# Patient Record
Sex: Male | Born: 1955 | Race: White | Hispanic: No | Marital: Married | Smoking: Never smoker
Health system: Southern US, Community
[De-identification: ages and names within clinical notes are randomized; demographics above are authoritative.]

## PROBLEM LIST (undated history)

## (undated) DIAGNOSIS — E785 Hyperlipidemia, unspecified: Secondary | ICD-10-CM

## (undated) HISTORY — DX: Hyperlipidemia, unspecified: E78.5

## (undated) HISTORY — PX: OTHER SURGICAL HISTORY: SHX169

## (undated) HISTORY — PX: CATARACT EXTRACTION: SUR2

## (undated) HISTORY — PX: VASECTOMY: SHX75

---

## 2015-10-08 DIAGNOSIS — K644 Residual hemorrhoidal skin tags: Secondary | ICD-10-CM | POA: Insufficient documentation

## 2015-12-24 DIAGNOSIS — E782 Mixed hyperlipidemia: Secondary | ICD-10-CM | POA: Diagnosis not present

## 2015-12-24 DIAGNOSIS — R7301 Impaired fasting glucose: Secondary | ICD-10-CM | POA: Diagnosis not present

## 2015-12-24 DIAGNOSIS — E291 Testicular hypofunction: Secondary | ICD-10-CM | POA: Diagnosis not present

## 2016-04-24 DIAGNOSIS — H25811 Combined forms of age-related cataract, right eye: Secondary | ICD-10-CM | POA: Diagnosis not present

## 2016-04-24 DIAGNOSIS — Z01818 Encounter for other preprocedural examination: Secondary | ICD-10-CM | POA: Diagnosis not present

## 2016-05-01 DIAGNOSIS — H2511 Age-related nuclear cataract, right eye: Secondary | ICD-10-CM | POA: Diagnosis not present

## 2016-05-01 DIAGNOSIS — H52221 Regular astigmatism, right eye: Secondary | ICD-10-CM | POA: Diagnosis not present

## 2016-05-01 DIAGNOSIS — H25811 Combined forms of age-related cataract, right eye: Secondary | ICD-10-CM | POA: Diagnosis not present

## 2016-05-12 DIAGNOSIS — Z1211 Encounter for screening for malignant neoplasm of colon: Secondary | ICD-10-CM | POA: Diagnosis not present

## 2016-05-12 DIAGNOSIS — Z800 Family history of malignant neoplasm of digestive organs: Secondary | ICD-10-CM | POA: Diagnosis not present

## 2016-05-12 LAB — HM COLONOSCOPY

## 2016-06-23 DIAGNOSIS — E291 Testicular hypofunction: Secondary | ICD-10-CM | POA: Diagnosis not present

## 2016-06-23 DIAGNOSIS — R7301 Impaired fasting glucose: Secondary | ICD-10-CM | POA: Diagnosis not present

## 2016-06-23 DIAGNOSIS — J018 Other acute sinusitis: Secondary | ICD-10-CM | POA: Diagnosis not present

## 2016-06-23 DIAGNOSIS — E782 Mixed hyperlipidemia: Secondary | ICD-10-CM | POA: Diagnosis not present

## 2017-05-15 DIAGNOSIS — J0100 Acute maxillary sinusitis, unspecified: Secondary | ICD-10-CM | POA: Diagnosis not present

## 2017-05-15 DIAGNOSIS — S0101XA Laceration without foreign body of scalp, initial encounter: Secondary | ICD-10-CM | POA: Diagnosis not present

## 2017-07-12 DIAGNOSIS — J018 Other acute sinusitis: Secondary | ICD-10-CM | POA: Diagnosis not present

## 2017-08-16 DIAGNOSIS — E782 Mixed hyperlipidemia: Secondary | ICD-10-CM | POA: Diagnosis not present

## 2017-08-16 DIAGNOSIS — E291 Testicular hypofunction: Secondary | ICD-10-CM | POA: Diagnosis not present

## 2017-08-16 DIAGNOSIS — R7301 Impaired fasting glucose: Secondary | ICD-10-CM | POA: Diagnosis not present

## 2017-08-16 DIAGNOSIS — J018 Other acute sinusitis: Secondary | ICD-10-CM | POA: Diagnosis not present

## 2017-08-16 DIAGNOSIS — Z23 Encounter for immunization: Secondary | ICD-10-CM | POA: Diagnosis not present

## 2018-08-14 DIAGNOSIS — L570 Actinic keratosis: Secondary | ICD-10-CM | POA: Diagnosis not present

## 2018-08-14 DIAGNOSIS — C44619 Basal cell carcinoma of skin of left upper limb, including shoulder: Secondary | ICD-10-CM | POA: Diagnosis not present

## 2018-08-14 DIAGNOSIS — D225 Melanocytic nevi of trunk: Secondary | ICD-10-CM | POA: Diagnosis not present

## 2018-08-14 DIAGNOSIS — C44212 Basal cell carcinoma of skin of right ear and external auricular canal: Secondary | ICD-10-CM | POA: Diagnosis not present

## 2018-08-14 DIAGNOSIS — D2239 Melanocytic nevi of other parts of face: Secondary | ICD-10-CM | POA: Diagnosis not present

## 2018-08-14 DIAGNOSIS — L720 Epidermal cyst: Secondary | ICD-10-CM | POA: Diagnosis not present

## 2018-08-14 DIAGNOSIS — C4491 Basal cell carcinoma of skin, unspecified: Secondary | ICD-10-CM | POA: Diagnosis not present

## 2018-08-14 DIAGNOSIS — D224 Melanocytic nevi of scalp and neck: Secondary | ICD-10-CM | POA: Diagnosis not present

## 2018-09-09 DIAGNOSIS — C44212 Basal cell carcinoma of skin of right ear and external auricular canal: Secondary | ICD-10-CM | POA: Diagnosis not present

## 2018-09-17 DIAGNOSIS — C44619 Basal cell carcinoma of skin of left upper limb, including shoulder: Secondary | ICD-10-CM | POA: Diagnosis not present

## 2019-02-14 DIAGNOSIS — Z125 Encounter for screening for malignant neoplasm of prostate: Secondary | ICD-10-CM | POA: Diagnosis not present

## 2019-02-14 DIAGNOSIS — Z0000 Encounter for general adult medical examination without abnormal findings: Secondary | ICD-10-CM | POA: Diagnosis not present

## 2019-02-21 DIAGNOSIS — E291 Testicular hypofunction: Secondary | ICD-10-CM | POA: Diagnosis not present

## 2019-04-21 DIAGNOSIS — Z23 Encounter for immunization: Secondary | ICD-10-CM | POA: Diagnosis not present

## 2019-04-21 DIAGNOSIS — H6122 Impacted cerumen, left ear: Secondary | ICD-10-CM | POA: Diagnosis not present

## 2019-05-03 DIAGNOSIS — Z20828 Contact with and (suspected) exposure to other viral communicable diseases: Secondary | ICD-10-CM | POA: Diagnosis not present

## 2019-05-03 DIAGNOSIS — J00 Acute nasopharyngitis [common cold]: Secondary | ICD-10-CM | POA: Diagnosis not present

## 2019-05-03 DIAGNOSIS — R6889 Other general symptoms and signs: Secondary | ICD-10-CM | POA: Diagnosis not present

## 2019-08-15 ENCOUNTER — Ambulatory Visit: Payer: Self-pay | Admitting: Family Medicine

## 2019-08-19 DIAGNOSIS — E291 Testicular hypofunction: Secondary | ICD-10-CM | POA: Insufficient documentation

## 2019-08-19 DIAGNOSIS — R7301 Impaired fasting glucose: Secondary | ICD-10-CM | POA: Insufficient documentation

## 2019-08-19 DIAGNOSIS — J301 Allergic rhinitis due to pollen: Secondary | ICD-10-CM | POA: Insufficient documentation

## 2019-08-19 DIAGNOSIS — E782 Mixed hyperlipidemia: Secondary | ICD-10-CM | POA: Insufficient documentation

## 2019-08-20 ENCOUNTER — Encounter: Payer: Self-pay | Admitting: Legal Medicine

## 2019-08-20 ENCOUNTER — Ambulatory Visit: Payer: Self-pay | Admitting: Legal Medicine

## 2019-08-20 ENCOUNTER — Other Ambulatory Visit: Payer: Self-pay

## 2019-08-20 VITALS — BP 106/70 | HR 72 | Temp 97.8°F | Resp 16 | Ht 74.0 in | Wt 249.8 lb

## 2019-08-20 DIAGNOSIS — Z0289 Encounter for other administrative examinations: Secondary | ICD-10-CM

## 2019-08-20 LAB — POCT URINALYSIS DIPSTICK
Bilirubin, UA: 0
Blood, UA: 0
Glucose, UA: NEGATIVE
Ketones, UA: 0
Leukocytes, UA: NEGATIVE
Nitrite, UA: 0
Protein, UA: NEGATIVE
Spec Grav, UA: 1.02 (ref 1.010–1.025)
Urobilinogen, UA: 0.2 E.U./dL
pH, UA: 6 (ref 5.0–8.0)

## 2019-08-20 LAB — POCT URINALYSIS DIP (CLINITEK)
Bilirubin, UA: NEGATIVE
Blood, UA: NEGATIVE
Glucose, UA: NEGATIVE mg/dL
Ketones, POC UA: NEGATIVE mg/dL
Leukocytes, UA: NEGATIVE
Nitrite, UA: NEGATIVE
POC PROTEIN,UA: NEGATIVE
Spec Grav, UA: 1.02 (ref 1.010–1.025)
Urobilinogen, UA: 0.2 E.U./dL
pH, UA: 6 (ref 5.0–8.0)

## 2019-08-20 NOTE — Progress Notes (Signed)
Acute Office Visit  Subjective:    Patient ID: James Craig, male    DOB: 06/17/1956, 64 y.o.   MRN: [id number]  Chief Complaint  Patient presents with  . DOT Physical    HPI Patient is in today for DOT physical.  He has no changed history from last visit  Past Medical History:  Diagnosis Date  . Hyperlipidemia     History reviewed. No pertinent surgical history.  Family History  Family history unknown: Yes    Social History   Socioeconomic History  . Marital status: Married    Spouse name: Not on file  . Number of children: Not on file  . Years of education: Not on file  . Highest education level: Not on file  Occupational History    Employer: [employer]  Tobacco Use  . Smoking status: Never Smoker  . Smokeless tobacco: Never Used  Substance and Sexual Activity  . Alcohol use: Never  . Drug use: Never  . Sexual activity: Not on file  Other Topics Concern  . Not on file  Social History Narrative  . Not on file   Social Determinants of Health   Financial Resource Strain:   . Difficulty of Paying Living Expenses: Not on file  Food Insecurity:   . Worried About Charity fundraiser in the Last Year: Not on file  . Ran Out of Food in the Last Year: Not on file  Transportation Needs:   . Lack of Transportation (Medical): Not on file  . Lack of Transportation (Non-Medical): Not on file  Physical Activity:   . Days of Exercise per Week: Not on file  . Minutes of Exercise per Session: Not on file  Stress:   . Feeling of Stress : Not on file  Social Connections:   . Frequency of Communication with Friends and Family: Not on file  . Frequency of Social Gatherings with Friends and Family: Not on file  . Attends Religious Services: Not on file  . Active Member of Clubs or Organizations: Not on file  . Attends Archivist Meetings: Not on file  . Marital Status: Not on file  Intimate Partner Violence:   . Fear of Current or Ex-Partner: Not on  file  . Emotionally Abused: Not on file  . Physically Abused: Not on file  . Sexually Abused: Not on file    Outpatient Medications Prior to Visit  Medication Sig Dispense Refill  . indomethacin (INDOCIN) 50 MG capsule Take 50 mg by mouth 3 (three) times daily after meals.    . rosuvastatin (CRESTOR) 10 MG tablet Take 10 mg by mouth daily.    . Testosterone Cypionate 200 MG/ML SOLN Inject 1 mL as directed every 14 (fourteen) days.     No facility-administered medications prior to visit.    Allergies  Allergen Reactions  . Penicillins Hives    Review of Systems  Constitutional: Negative.   HENT: Negative.   Eyes: Negative.   Respiratory: Negative.   Cardiovascular: Negative.   Gastrointestinal: Negative.   Endocrine: Negative.   Genitourinary: Negative.   Musculoskeletal: Negative.   Skin: Negative.   Neurological: Negative.        Objective:    Physical Exam Constitutional:      Appearance: Normal appearance.  HENT:     Head: Normocephalic and atraumatic.     Nose: Nose normal.  Eyes:     Extraocular Movements: Extraocular movements intact.     Conjunctiva/sclera: Conjunctivae normal.  Pupils: Pupils are equal, round, and reactive to light.  Cardiovascular:     Rate and Rhythm: Normal rate and regular rhythm.     Pulses: Normal pulses.     Heart sounds: Normal heart sounds.  Pulmonary:     Effort: Pulmonary effort is normal.     Breath sounds: Normal breath sounds.  Abdominal:     General: Bowel sounds are normal.     Palpations: Abdomen is soft.  Genitourinary:    Penis: Normal.      Testes: Normal.  Musculoskeletal:        General: Normal range of motion.     Cervical back: Normal range of motion and neck supple.  Skin:    General: Skin is warm and dry.     Capillary Refill: Capillary refill takes less than 2 seconds.  Neurological:     General: No focal deficit present.     Mental Status: He is alert and oriented to person, place, and time.  Mental status is at baseline.  Psychiatric:        Mood and Affect: Mood normal.        Behavior: Behavior normal.        Thought Content: Thought content normal.        Judgment: Judgment normal.     BP 106/70   Pulse 72   Temp 97.8 F (36.6 C)   Resp 16   Ht 6' 2"  (1.88 m)   Wt 249 lb 12.8 oz (113.3 kg)   SpO2 97%   BMI 32.07 kg/m  Wt Readings from Last 3 Encounters:  08/20/19 249 lb 12.8 oz (113.3 kg)    Health Maintenance Due  Topic Date Due  . Hepatitis C Screening  1955-11-23  . HIV Screening  06/23/1973  . TETANUS/TDAP  06/23/1977  . COLONOSCOPY  06/23/2008  . INFLUENZA VACCINE  02/08/2019    There are no preventive care reminders to display for this patient.   No results found for: TSH No results found for: WBC, HGB, HCT, MCV, PLT No results found for: NA, K, CHLORIDE, CO2, GLUCOSE, BUN, CREATININE, BILITOT, ALKPHOS, AST, ALT, PROT, ALBUMIN, CALCIUM, ANIONGAP, EGFR, GFR No results found for: CHOL No results found for: HDL No results found for: LDLCALC No results found for: TRIG No results found for: CHOLHDL No results found for: HGBA1C     Assessment & Plan:   Problem List Items Addressed This Visit    None    Visit Diagnoses    Encounter for examination required by Department of Transportation (DOT)    -  Primary   Relevant Orders   POCT urinalysis dipstick (Completed)   POCT URINALYSIS DIP (CLINITEK) (Completed)     DOT form completed for 2 year term, no restrictions  No orders of the defined types were placed in this encounter.    Reinaldo Meeker, MD

## 2019-09-19 DIAGNOSIS — D2261 Melanocytic nevi of right upper limb, including shoulder: Secondary | ICD-10-CM | POA: Diagnosis not present

## 2019-09-19 DIAGNOSIS — D225 Melanocytic nevi of trunk: Secondary | ICD-10-CM | POA: Diagnosis not present

## 2019-09-19 DIAGNOSIS — D1801 Hemangioma of skin and subcutaneous tissue: Secondary | ICD-10-CM | POA: Diagnosis not present

## 2019-09-19 DIAGNOSIS — L570 Actinic keratosis: Secondary | ICD-10-CM | POA: Diagnosis not present

## 2019-09-19 DIAGNOSIS — Z85828 Personal history of other malignant neoplasm of skin: Secondary | ICD-10-CM | POA: Diagnosis not present

## 2019-09-21 ENCOUNTER — Encounter: Payer: Self-pay | Admitting: Legal Medicine

## 2019-09-22 ENCOUNTER — Other Ambulatory Visit: Payer: Self-pay | Admitting: Family Medicine

## 2019-12-10 DIAGNOSIS — M7918 Myalgia, other site: Secondary | ICD-10-CM | POA: Diagnosis not present

## 2019-12-10 DIAGNOSIS — J069 Acute upper respiratory infection, unspecified: Secondary | ICD-10-CM | POA: Diagnosis not present

## 2020-04-15 ENCOUNTER — Ambulatory Visit (INDEPENDENT_AMBULATORY_CARE_PROVIDER_SITE_OTHER): Payer: BC Managed Care – PPO | Admitting: Family Medicine

## 2020-04-15 ENCOUNTER — Encounter: Payer: Self-pay | Admitting: Family Medicine

## 2020-04-15 ENCOUNTER — Other Ambulatory Visit: Payer: Self-pay

## 2020-04-15 VITALS — BP 120/82 | HR 60 | Temp 97.6°F | Ht 74.0 in | Wt 251.0 lb

## 2020-04-15 DIAGNOSIS — Z0000 Encounter for general adult medical examination without abnormal findings: Secondary | ICD-10-CM | POA: Diagnosis not present

## 2020-04-15 DIAGNOSIS — Z23 Encounter for immunization: Secondary | ICD-10-CM | POA: Diagnosis not present

## 2020-04-15 DIAGNOSIS — H833X3 Noise effects on inner ear, bilateral: Secondary | ICD-10-CM | POA: Diagnosis not present

## 2020-04-15 NOTE — Progress Notes (Signed)
Subjective:  Patient ID: James Craig, male    DOB: 27-Jun-1956  Age: 64 y.o. MRN: [id number]  Chief Complaint  Patient presents with  . Annual Exam    HPI  Well Adult Physical: Patient here for a comprehensive physical exam.The patient reports  Do you take any herbs or supplements that were not prescribed by a doctor? No  Are you taking calcium supplements? No  Are you taking aspirin daily? No  Encounter for general adult medical examination without abnormal findings  Physical: Patient's last physical exam was 1 year ago .  Weight: BMI 32-obese Blood Pressure: Normal (120/82)   Medical History: Patient history reviewed ; Family history reviewed ;  Allergies Reviewed: No change in current allergies ;  Medications Reviewed: Medications reviewed - no changes ;  Lipids: Labs drawn today. (Last labs 02/14/2019 TC 212, TRIG 77, LDL 148, HDL 49) Smoking: Life-long non-smoker ;  Physical Activity: Exercises at least 1 times per week (walks) is very active at work ;  Alcohol/Drug Use: Drinks 1 beer approximately once a month on social occasions ; No illicit drug use ;  Patient is not afflicted from Stress Incontinence and Urge Incontinence  Safety: reviewed ; Patient wears a seat belt, has smoke detectors, has carbon monoxide detectors, practices appropriate gun safety, and wears sunscreen with extended sun exposure. Dental Care: biannual cleanings, brushes and flosses daily. Ophthalmology/Optometry: Annual visit. Hearing loss: none Vision impairments: none.  Has implants due to previously having cataracts. Last PSA: 0.600 On 02/14/2019 (Fx Hx (Dad) of Prostate cancer.)             Social History   Socioeconomic History  . Marital status: Married    Spouse name: Not on file  . Number of children: Not on file  . Years of education: Not on file  . Highest education level: Not on file  Occupational History    Employer: [employer]  Tobacco Use  . Smoking status: Never  Smoker  . Smokeless tobacco: Never Used  Substance and Sexual Activity  . Alcohol use: Never  . Drug use: Never  . Sexual activity: Not on file  Other Topics Concern  . Not on file  Social History Narrative  . Not on file   Social Determinants of Health   Financial Resource Strain:   . Difficulty of Paying Living Expenses: Not on file  Food Insecurity:   . Worried About Programme researcher, broadcasting/film/video in the Last Year: Not on file  . Ran Out of Food in the Last Year: Not on file  Transportation Needs:   . Lack of Transportation (Medical): Not on file  . Lack of Transportation (Non-Medical): Not on file  Physical Activity:   . Days of Exercise per Week: Not on file  . Minutes of Exercise per Session: Not on file  Stress:   . Feeling of Stress : Not on file  Social Connections:   . Frequency of Communication with Friends and Family: Not on file  . Frequency of Social Gatherings with Friends and Family: Not on file  . Attends Religious Services: Not on file  . Active Member of Clubs or Organizations: Not on file  . Attends Banker Meetings: Not on file  . Marital Status: Not on file   Past Medical History:  Diagnosis Date  . Hyperlipidemia    No past surgical history on file.  Family History  Family history unknown: Yes   Social History   Socioeconomic History  .  Marital status: Married    Spouse name: Not on file  . Number of children: Not on file  . Years of education: Not on file  . Highest education level: Not on file  Occupational History    Employer: [employer]  Tobacco Use  . Smoking status: Never Smoker  . Smokeless tobacco: Never Used  Substance and Sexual Activity  . Alcohol use: Never  . Drug use: Never  . Sexual activity: Not on file  Other Topics Concern  . Not on file  Social History Narrative  . Not on file   Social Determinants of Health   Financial Resource Strain:   . Difficulty of Paying Living Expenses: Not on file  Food  Insecurity:   . Worried About Programme researcher, broadcasting/film/video in the Last Year: Not on file  . Ran Out of Food in the Last Year: Not on file  Transportation Needs:   . Lack of Transportation (Medical): Not on file  . Lack of Transportation (Non-Medical): Not on file  Physical Activity:   . Days of Exercise per Week: Not on file  . Minutes of Exercise per Session: Not on file  Stress:   . Feeling of Stress : Not on file  Social Connections:   . Frequency of Communication with Friends and Family: Not on file  . Frequency of Social Gatherings with Friends and Family: Not on file  . Attends Religious Services: Not on file  . Active Member of Clubs or Organizations: Not on file  . Attends Banker Meetings: Not on file  . Marital Status: Not on file   Review of Systems  Constitutional: Positive for diaphoresis (night). Negative for chills, fatigue and fever.  HENT: Negative for congestion (sometimes), ear pain and sore throat.   Respiratory: Negative for cough and shortness of breath.   Cardiovascular: Negative for chest pain.  Gastrointestinal: Positive for diarrhea (Recent intolerance to dairy, increased bowel movement volume ). Negative for abdominal pain, constipation, nausea and vomiting.  Genitourinary: Negative for dysuria and urgency.  Musculoskeletal: Positive for back pain (related to heavy lifting and workload). Negative for arthralgias and myalgias.  Neurological: Negative for dizziness and headaches.  Psychiatric/Behavioral: Negative for dysphoric mood. The patient is not nervous/anxious.      Objective:  BP 120/82   Pulse 60   Temp 97.6 F (36.4 C)   Ht 6\' 2"  (1.88 m)   Wt 251 lb (113.9 kg)   SpO2 99%   BMI 32.23 kg/m   BP/Weight 04/15/2020 08/20/2019  Systolic BP 120 106  Diastolic BP 82 70  Wt. (Lbs) 251 249.8  BMI 32.23 32.07    Physical Exam Constitutional:      Appearance: Normal appearance.  HENT:     Head: Normocephalic.     Right Ear: Tympanic  membrane normal.     Left Ear: Tympanic membrane normal.     Nose: Nose normal.     Mouth/Throat:     Mouth: Mucous membranes are moist.  Eyes:     Pupils: Pupils are equal, round, and reactive to light.  Cardiovascular:     Rate and Rhythm: Normal rate and regular rhythm.     Pulses: Normal pulses.     Heart sounds: Normal heart sounds.  Pulmonary:     Breath sounds: Normal breath sounds.  Abdominal:     General: Bowel sounds are normal.     Palpations: Abdomen is soft. There is no mass.     Tenderness:  There is no abdominal tenderness.     Hernia: No hernia is present.  Genitourinary:    Penis: Normal.      Testes: Normal.     Prostate: Normal.     Rectum: Normal.  Musculoskeletal:        General: Normal range of motion.     Cervical back: Neck supple.  Skin:    General: Skin is warm and dry.     Capillary Refill: Capillary refill takes less than 2 seconds.  Neurological:     General: No focal deficit present.     Mental Status: He is alert and oriented to person, place, and time.     No results found for: WBC, HGB, HCT, PLT, GLUCOSE, CHOL, TRIG, HDL, LDLDIRECT, LDLCALC, ALT, AST, NA, K, CL, CREATININE, BUN, CO2, TSH, PSA, INR, GLUF, HGBA1C, MICROALBUR    Assessment & Plan:  1. Routine medical exam - CBC with Differential/Platelet - Comprehensive metabolic panel - Lipid panel - TSH - PSA - Testosterone,Free and Total    Body mass index is 32.23 kg/m.  1. Routine medical exam - CBC with Differential/Platelet - Comprehensive metabolic panel - Lipid panel - TSH - PSA - Testosterone,Free and Total  2. Encounter for immunization - Flu Vaccine MDCK QUAD PF  3. Noise-induced hearing loss of both ears - Ambulatory referral to ENT     Monitor diet and intolerance to dairy; Increase fiber in diet   Increase weekly exercise Continue testosterone replacement Return in 2 weeks for shingles vaccine Referral to ENT for bilateral hearing loss   Health  Maintenance  Topic Date Due  .  Hepatitis C: One time screening is recommended by Center for Disease Control  (CDC) for  adults born from [birth year] through 1963.   Never done  . COVID-19 Vaccine (1) Never done  . HIV Screening  Never done  . Tetanus Vaccine  Never done  . Colon Cancer Screening  Never done  . Flu Shot  02/08/2020     AN INDIVIDUALIZED CARE PLAN: was established or reinforced today.   SELF MANAGEMENT: The patient and I together assessed ways to personally work towards obtaining the recommended goals  Support needs The patient and/or family needs were assessed and services were offered if appropriate.    Follow-up: Follow-up in 2 weeks for Shingles vaccine (nurse visit)  An After Visit Summary was printed and given to the patient.  Flonnie Hailstone, DNP [facility] [phone]

## 2020-04-15 NOTE — Patient Instructions (Addendum)
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 This includes:  A yearly physical exam. This is also called an annual well check.  Regular dental and eye exams.  Immunizations.  Screening for certain conditions.  Healthy lifestyle choices, such as eating a healthy diet, getting regular exercise, not using drugs or products that contain nicotine and tobacco, and limiting alcohol use. What can I expect for my preventive care visit? Physical exam Your health care provider will check:  Height and weight. These may be used to calculate body mass index (BMI), which is a measurement that tells if you are at a healthy weight.  Heart rate and blood pressure.  Your skin for abnormal spots. Counseling Your health care provider may ask you questions about:  Alcohol, tobacco, and drug use.  Emotional well-being.  Home and relationship well-being.  Sexual activity.  Eating habits.  Work and work Statistician. What immunizations do I need?  Influenza (flu) vaccine  This is recommended every year. Tetanus, diphtheria, and pertussis (Tdap) vaccine  You may need a Td booster every 10 years. Varicella (chickenpox) vaccine  You may need this vaccine if you have not already been vaccinated. Zoster (shingles) vaccine  You may need this after age 30. Measles, mumps, and rubella (MMR) vaccine  You may need at least one dose of MMR if you were born in 1957 or later. You may also need a second dose. Pneumococcal conjugate (PCV13) vaccine  You may need this if you have certain conditions and were not previously vaccinated. Pneumococcal polysaccharide (PPSV23) vaccine  You may need one or two doses if you smoke cigarettes or if you have certain  conditions. Meningococcal conjugate (MenACWY) vaccine  You may need this if you have certain conditions. Hepatitis A vaccine  You may need this if you have certain conditions or if you travel or work in places where you may be exposed to hepatitis A. Hepatitis B vaccine  You may need this if you have certain conditions or if you travel or work in places where you may be exposed to hepatitis B. Haemophilus influenzae type b (Hib) vaccine  You may need this if you have certain risk factors. Human papillomavirus (HPV) vaccine  If recommended by your health care provider, you may need three doses over 6 months. You may receive vaccines as individual doses or as more than one vaccine together in one shot (combination vaccines). Talk with your health care provider about the risks and benefits of combination vaccines. What tests do I need? Blood tests  Lipid and cholesterol levels. These may be checked every 5 years, or more frequently if you are over 24 years old.  Hepatitis C test.  Hepatitis B test. Screening  Lung cancer screening. You may have this screening every year starting at age 20 if you have a 30-pack-year history of smoking and currently smoke or have quit within the past 15 years.  Prostate cancer screening. Recommendations will vary depending on your family history and other risks.  Colorectal cancer screening. All adults should have this screening starting at age 3 and continuing until age 51. Your health care provider may recommend screening at age 64 if you are at increased risk. You will have tests every 1-10 years, depending on your results and the type of screening  test.  Diabetes screening. This is done by checking your blood sugar (glucose) after you have not eaten for a while (fasting). You may have this done every 1-3 years.  Sexually transmitted disease (STD) testing. Follow these instructions at home: Eating and drinking  Eat a diet that includes fresh  fruits and vegetables, whole grains, lean protein, and low-fat dairy products.  Take vitamin and mineral supplements as recommended by your health care provider.  Do not drink alcohol if your health care provider tells you not to drink.  If you drink alcohol: ? Limit how much you have to 0-2 drinks a day. ? Be aware of how much alcohol is in your drink. In the [place] one drink equals one 12 oz bottle of beer (355 mL), one 5 oz glass of wine (148 mL), or one 1 oz glass of hard liquor (44 mL). Lifestyle  Take daily care of your teeth and gums.  Stay active. Exercise for at least 30 minutes on 5 or more days each week.  Do not use any products that contain nicotine or tobacco, such as cigarettes, e-cigarettes, and chewing tobacco. If you need help quitting, ask your health care provider.  If you are sexually active, practice safe sex. Use a condom or other form of protection to prevent STIs (sexually transmitted infections).  Talk with your health care provider about taking a low-dose aspirin every day starting at age 47. What's next?  Go to your health care provider once a year for a well check visit.  Ask your health care provider how often you should have your eyes and teeth checked.  Stay up to date on all vaccines. This information is not intended to replace advice given to you by your health care provider. Make sure you discuss any questions you have with your health care provider. Document Revised: 06/20/2018 Document Reviewed: 06/20/2018 Elsevier Patient Education  [place], Adult Lactose intolerance is when the body is not able to digest lactose, a natural sugar that is found in milk and milk products. If you are lactose intolerant, avoiding foods and drinks that contain lactose may help ease digestive problems such as diarrhea or stomach pain. What are tips for following this plan? Reading food labels  Check food ingredient lists  carefully. Avoid foods made with butter, cream, milk, milk solids, milk powder, curd, caseinate, or whey.  Avoid products with the note "may contain milk."  Look for the words "lactose-free" or "lactose-reduced" on food labels. You can eat lactose-free foods, and you may be able to eat small amounts of lactose-reduced foods. Shopping  Look for nondairy substitutes, such as: ? Nondairy creamer. ? Almond or soy milk. ? Soy or coconut yogurt. ? Dairy-free cheese.  Buy lactose-free cow milk. Cooking  Avoid cooking with butter. Use vegetable, nut, and seed oils instead.  Prepare soups without cream. Use other products to thicken soups, such as corn starch or tomato paste. Meal planning  Avoid eating foods that contain lactose.  Some people with lactose intolerance can eat foods that contain small amounts of lactose. Foods that contain less than 1 gram of lactose per serving include: ? 1-2 oz of aged cheese, such as Parmesan, Swiss, or cheddar. ? 2 Tbsp of cream cheese. ? ? cup of cottage cheese. ?  cup of ricotta cheese.  Some people are able to tolerate cultured dairy products, such as yogurt, buttermilk, and kefir. The healthy bacteria in these products helps you digest lactose.  If you decide to try a food that contains lactose: ? Eat only one food with lactose in it at a time. ? Eat only a small amount of the food. ? Stop eating the food if your symptoms return. Getting enough calcium  Milk and milk products contain a lot of calcium, which is an important nutrient for your health. When you avoid milk and milk products, make sure to get calcium from other foods.  Talk with your health care provider about how much calcium you need each day. The amount of calcium you need each day depends on your age and overall health.  Nondairy foods that are high in calcium include: ? Sardines and canned salmon. ? Dried beans. ? Almonds. ? Turnip greens, collards, kale, and  broccoli. ? Calcium-fortified soy milk and tofu. ? Calcium-fortified orange juice.  Talk with your health care provider about vitamin and mineral supplements. Take supplements only as directed. Summary  Avoiding foods and drinks that contain lactose may help ease digestive problems such as diarrhea or stomach pain.  When you avoid milk and milk products, make sure to get calcium from other foods.  Take vitamin and mineral supplements only as directed by your health care provider. This information is not intended to replace advice given to you by your health care provider. Make sure you discuss any questions you have with your health care provider. Document Revised: 06/08/2017 Document Reviewed: 10/06/2016 Elsevier Patient Education  [place].  Testosterone Replacement Therapy  Testosterone replacement therapy (TRT) is used to treat men who have a low testosterone level (hypogonadism). Testosterone is a male hormone that is produced in the testicles. It is responsible for typically male characteristics and for maintaining a man's sex drive and the ability to get an erection. Testosterone also supports bone and muscle health. TRT can be a gel, liquid, or patch that you put on your skin. It can also be in the form of a tablet or an injection. In some cases, your health care provider may insert long-acting pellets under your skin. In most men, the level of testosterone starts to decline gradually after age 21. Low testosterone can also be caused by certain medical conditions, medicines, and obesity. Your health care provider can diagnose hypogonadism with at least two blood tests that are done early in the morning. Low testosterone may not need to be treated. TRT is usually a choice that you make with your health care provider. Your health care provider may recommend TRT if you have low testosterone that is causing symptoms, such as:  Low sex drive.  Erection problems.  Breast  enlargement.  Loss of body hair.  Weak muscles or bones.  Shrinking testicles.  Increased body fat.  Low energy.  Hot flashes.  Depression.  Decreased work Systems analyst. TRT is a lifetime treatment. If you stop treatment, your testosterone will drop, and your symptoms may return. What are the risks? Testosterone replacement therapy may have side effects, including:  Lower sperm count.  Skin irritation at the application or injection site.  Mouth irritation if you take an oral tablet.  Acne.  Swelling of your legs or feet.  Tender breasts.  Dizziness.  Sleep disturbance.  Mood swings.  Possible increased risk of stroke or heart attack. Testosterone replacement therapy may also increase your risk for prostate cancer or male breast cancer. You should not use TRT if you have either of those conditions. Your health care provider also may not recommend TRT if:  You are  suspected of having prostate cancer.  You want to father a child.  You have a high number of red blood cells.  You have untreated sleep apnea.  You have a very large prostate. Supplies needed:  Your health care provider will prescribe the testosterone gel, solution, or medicine that you need. If your health care provider teaches you to do self-injections at home, you will also need: ? Your medicine vial. ? Disposable needles and syringes. ? Alcohol swabs. ? A needle disposal container. ? Adhesive bandages. How to use testosterone replacement therapy Your health care provider will help you find the TRT option that will work best for you based on your preference, the side effects, and the cost. You may:  Rub testosterone gel on your upper arm or shoulder every day after a shower. This is the most common type of TRT. Do not let women or children come in contact with the gel.  Apply a testosterone solution under your arms once each day.  Place a testosterone patch on your skin once each  day.  Dissolve a testosterone tablet in your mouth twice each day.  Have a testosterone pellet inserted under your skin by your health care provider. This will be replaced every 3-6 months.  Use testosterone nasal spray three times each day.  Get testosterone injections. For some types of testosterone, your health care provider will give you this injection. With other types of testosterone, you may be taught to give injections to yourself. The frequency of injections may vary based on the type of testosterone that you receive. Follow these instructions at home:  Take over-the-counter and prescription medicines only as told by your health care provider.  Lose weight if you are overweight. Ask your health care provider to help you start a healthy diet and exercise program to reach and maintain a healthy weight.  Work with your health care provider to treat other medical conditions that may lower your testosterone. These include obesity, high blood pressure, high cholesterol, diabetes, liver disease, kidney disease, and sleep apnea.  Keep all follow-up visits as told by your health care provider. This is important. General recommendations  Discuss all risks and benefits with your health care provider before starting therapy.  Work with your health care provider to check your prostate health and do blood testing before you start therapy.  Do not use any testosterone replacement therapies that are not prescribed by your health care provider or not approved for use in the [place]  Do not use TRT for bodybuilding or to improve sexual performance. TRT should be used only to treat symptoms of low testosterone.  Return for all repeat prostate checks and blood tests during therapy, as told by your health care provider. Where to find more information Learn more about testosterone replacement therapy from:  [place]:  www.urologyhealth.org/urologic-conditions/low-testosterone-(hypogonadism)  Endocrine Society: www.hormone.org/diseases-and-conditions/mens-health/hypogonadism Contact a health care provider if:  You have side effects from your testosterone replacement therapy.  You continue to have symptoms of low testosterone during treatment.  You develop new symptoms during treatment. Summary  Testosterone replacement therapy is only for men who have low testosterone as determined by blood testing and who have symptoms of low testosterone.  Testosterone replacement therapy should be prescribed only by a health care provider and should be used under the supervision of a health care provider.  You may not be able to take testosterone if you have certain medical conditions, including prostate cancer, male breast cancer, or heart disease.  Testosterone replacement therapy may have side effects and may make some medical conditions worse.  Talk with your health care provider about all the risks and benefits before you start therapy. This information is not intended to replace advice given to you by your health care provider. Make sure you discuss any questions you have with your health care provider. Document Revised: 07/09/2016 Document Reviewed: 03/16/2016 Elsevier Patient Education  2020 [place] Maintenance, Male Adopting a healthy lifestyle and getting preventive care are important in promoting health and wellness. Ask your health care provider about:  The right schedule for you to have regular tests and exams.  Things you can do on your own to prevent diseases and keep yourself healthy. What should I know about diet, weight, and exercise? Eat a healthy diet   Eat a diet that includes plenty of vegetables, fruits, low-fat dairy products, and lean protein.  Do not eat a lot of foods that are high in solid fats, added sugars, or sodium. Maintain a healthy weight Body mass index  (BMI) is a measurement that can be used to identify possible weight problems. It estimates body fat based on height and weight. Your health care provider can help determine your BMI and help you achieve or maintain a healthy weight. Get regular exercise Get regular exercise. This is one of the most important things you can do for your health. Most adults should:  Exercise for at least 150 minutes each week. The exercise should increase your heart rate and make you sweat (moderate-intensity exercise).  Do strengthening exercises at least twice a week. This is in addition to the moderate-intensity exercise.  Spend less time sitting. Even light physical activity can be beneficial. Watch cholesterol and blood lipids Have your blood tested for lipids and cholesterol at 62 years of age, then have this test every 5 years. You may need to have your cholesterol levels checked more often if:  Your lipid or cholesterol levels are high.  You are older than 62 years of age.  You are at high risk for heart disease. What should I know about cancer screening? Many types of cancers can be detected early and may often be prevented. Depending on your health history and family history, you may need to have cancer screening at various ages. This may include screening for:  Colorectal cancer.  Prostate cancer.  Skin cancer.  Lung cancer. What should I know about heart disease, diabetes, and high blood pressure? Blood pressure and heart disease  High blood pressure causes heart disease and increases the risk of stroke. This is more likely to develop in people who have high blood pressure readings, are of African descent, or are overweight.  Talk with your health care provider about your target blood pressure readings.  Have your blood pressure checked: ? Every 3-5 years if you are 105-46 years of age. ? Every year if you are 15 years old or older.  If you are between the ages of 42 and 75 and are a  current or former smoker, ask your health care provider if you should have a one-time screening for abdominal aortic aneurysm (AAA). Diabetes Have regular diabetes screenings. This checks your fasting blood sugar level. Have the screening done:  Once every three years after age 70 if you are at a normal weight and have a low risk for diabetes.  More often and at a younger age if you are overweight or have a high risk for diabetes. What should  I know about preventing infection? Hepatitis B If you have a higher risk for hepatitis B, you should be screened for this virus. Talk with your health care provider to find out if you are at risk for hepatitis B infection. Hepatitis C Blood testing is recommended for:  Everyone born from [birth year] through 1963.  Anyone with known risk factors for hepatitis C. Sexually transmitted infections (STIs)  You should be screened each year for STIs, including gonorrhea and chlamydia, if: ? You are sexually active and are younger than 62 years of age. ? You are older than 62 years of age and your health care provider tells you that you are at risk for this type of infection. ? Your sexual activity has changed since you were last screened, and you are at increased risk for chlamydia or gonorrhea. Ask your health care provider if you are at risk.  Ask your health care provider about whether you are at high risk for HIV. Your health care provider may recommend a prescription medicine to help prevent HIV infection. If you choose to take medicine to prevent HIV, you should first get tested for HIV. You should then be tested every 3 months for as long as you are taking the medicine. Follow these instructions at home: Lifestyle  Do not use any products that contain nicotine or tobacco, such as cigarettes, e-cigarettes, and chewing tobacco. If you need help quitting, ask your health care provider.  Do not use street drugs.  Do not share needles.  Ask your health care  provider for help if you need support or information about quitting drugs. Alcohol use  Do not drink alcohol if your health care provider tells you not to drink.  If you drink alcohol: ? Limit how much you have to 0-2 drinks a day. ? Be aware of how much alcohol is in your drink. In the [place]S., one drink equals one 12 oz bottle of beer (355 mL), one 5 oz glass of wine (148 mL), or one 1 oz glass of hard liquor (44 mL). General instructions  Schedule regular health, dental, and eye exams.  Stay current with your vaccines.  Tell your health care provider if: ? You often feel depressed. ? You have ever been abused or do not feel safe at home. Summary  Adopting a healthy lifestyle and getting preventive care are important in promoting health and wellness.  Follow your health care provider's instructions about healthy diet, exercising, and getting tested or screened for diseases.  Follow your health care provider's instructions on monitoring your cholesterol and blood pressure. This information is not intended to replace advice given to you by your health care provider. Make sure you discuss any questions you have with your health care provider. Document Revised: 06/19/2018 Document Reviewed: 06/19/2018 Elsevier Patient Education  [place]. Influenza (Flu) Vaccine (Inactivated or Recombinant): What You Need to Know 1. Why get vaccinated? Influenza vaccine can prevent influenza (flu). Flu is a contagious disease that spreads around the [place] every year, usually between October and May. Anyone can get the flu, but it is more dangerous for some people. Infants and young children, people 33 years of age and older, pregnant women, and people with certain health conditions or a weakened immune system are at greatest risk of flu complications. Pneumonia, bronchitis, sinus infections and ear infections are examples of flu-related complications. If you have a medical condition, such  as heart disease, cancer or diabetes, flu can make it worse. Flu  can cause fever and chills, sore throat, muscle aches, fatigue, cough, headache, and runny or stuffy nose. Some people may have vomiting and diarrhea, though this is more common in children than adults. Each year thousands of people in the [place] die from flu, and many more are hospitalized. Flu vaccine prevents millions of illnesses and flu-related visits to the doctor each year. 2. Influenza vaccine CDC recommends everyone 53 months of age and older get vaccinated every flu season. Children 6 months through 6 years of age may need 2 doses during a single flu season. Everyone else needs only 1 dose each flu season. It takes about 2 weeks for protection to develop after vaccination. There are many flu viruses, and they are always changing. Each year a new flu vaccine is made to protect against three or four viruses that are likely to cause disease in the upcoming flu season. Even when the vaccine doesn't exactly match these viruses, it may still provide some protection. Influenza vaccine does not cause flu. Influenza vaccine may be given at the same time as other vaccines. 3. Talk with your health care provider Tell your vaccine provider if the person getting the vaccine:  Has had an allergic reaction after a previous dose of influenza vaccine, or has any severe, life-threatening allergies.  Has ever had Guillain-Barr Syndrome (also called GBS). In some cases, your health care provider may decide to postpone influenza vaccination to a future visit. People with minor illnesses, such as a cold, may be vaccinated. People who are moderately or severely ill should usually wait until they recover before getting influenza vaccine. Your health care provider can give you more information. 4. Risks of a vaccine reaction  Soreness, redness, and swelling where shot is given, fever, muscle aches, and headache can happen after influenza  vaccine.  There may be a very small increased risk of Guillain-Barr Syndrome (GBS) after inactivated influenza vaccine (the flu shot). Young children who get the flu shot along with pneumococcal vaccine (PCV13), and/or DTaP vaccine at the same time might be slightly more likely to have a seizure caused by fever. Tell your health care provider if a child who is getting flu vaccine has ever had a seizure. People sometimes faint after medical procedures, including vaccination. Tell your provider if you feel dizzy or have vision changes or ringing in the ears. As with any medicine, there is a very remote chance of a vaccine causing a severe allergic reaction, other serious injury, or death. 5. What if there is a serious problem? An allergic reaction could occur after the vaccinated person leaves the clinic. If you see signs of a severe allergic reaction (hives, swelling of the face and throat, difficulty breathing, a fast heartbeat, dizziness, or weakness), call [id number] and get the person to the nearest hospital. For other signs that concern you, call your health care provider. Adverse reactions should be reported to the Vaccine Adverse Event Reporting System (VAERS). Your health care provider will usually file this report, or you can do it yourself. Visit the VAERS website at www.vaers.SamedayNews.es or call (430)660-1938.VAERS is only for reporting reactions, and VAERS staff do not give medical advice. 6. The National Vaccine Injury Compensation Program The [employer] Vaccine Injury Compensation Program (VICP) is a federal program that was created to compensate people who may have been injured by certain vaccines. Visit the VICP website at GoldCloset.com.ee or call (713)718-3897 to learn about the program and about filing a claim. There is a time limit to  file a claim for compensation. 7. How can I learn more?  Ask your healthcare provider.  Call your local or state health department.  Contact  the Centers for Disease Control and Prevention (CDC): ? Call (302) 632-6929 (1-800-CDC-INFO) or ? Visit CDC's https://gibson.com/ Vaccine Information Statement (Interim) Inactivated Influenza Vaccine (02/21/2018) This information is not intended to replace advice given to you by your health care provider. Make sure you discuss any questions you have with your health care provider. Document Revised: 10/15/2018 Document Reviewed: 02/25/2018 Elsevier Patient Education  [place]. https://www.cdc.gov/vaccines/hcp/vis/vis-statements/tdap.pdf">  Tdap (Tetanus, Diphtheria, Pertussis) Vaccine: What You Need to Know 1. Why get vaccinated? Tdap vaccine can prevent tetanus, diphtheria, and pertussis. Diphtheria and pertussis spread from person to person. Tetanus enters the body through cuts or wounds.  TETANUS (T) causes painful stiffening of the muscles. Tetanus can lead to serious health problems, including being unable to open the mouth, having trouble swallowing and breathing, or death.  DIPHTHERIA (D) can lead to difficulty breathing, heart failure, paralysis, or death.  PERTUSSIS (aP), also known as "whooping cough," can cause uncontrollable, violent coughing which makes it hard to breathe, eat, or drink. Pertussis can be extremely serious in babies and young children, causing pneumonia, convulsions, brain damage, or death. In teens and adults, it can cause weight loss, loss of bladder control, passing out, and rib fractures from severe coughing. 2. Tdap vaccine Tdap is only for children 7 years and older, adolescents, and adults.  Adolescents should receive a single dose of Tdap, preferably at age 41 or 27 years. Pregnant women should get a dose of Tdap during every pregnancy, to protect the newborn from pertussis. Infants are most at risk for severe, life-threatening complications from pertussis. Adults who have never received Tdap should get a dose of Tdap. Also, adults should receive a  booster dose every 10 years, or earlier in the case of a severe and dirty wound or burn. Booster doses can be either Tdap or Td (a different vaccine that protects against tetanus and diphtheria but not pertussis). Tdap may be given at the same time as other vaccines. 3. Talk with your health care provider Tell your vaccine provider if the person getting the vaccine:  Has had an allergic reaction after a previous dose of any vaccine that protects against tetanus, diphtheria, or pertussis, or has any severe, life-threatening allergies.  Has had a coma, decreased level of consciousness, or prolonged seizures within 7 days after a previous dose of any pertussis vaccine (DTP, DTaP, or Tdap).  Has seizures or another nervous system problem.  Has ever had Guillain-Barr Syndrome (also called GBS).  Has had severe pain or swelling after a previous dose of any vaccine that protects against tetanus or diphtheria. In some cases, your health care provider may decide to postpone Tdap vaccination to a future visit.  People with minor illnesses, such as a cold, may be vaccinated. People who are moderately or severely ill should usually wait until they recover before getting Tdap vaccine.  Your health care provider can give you more information. 4. Risks of a vaccine reaction  Pain, redness, or swelling where the shot was given, mild fever, headache, feeling tired, and nausea, vomiting, diarrhea, or stomachache sometimes happen after Tdap vaccine. People sometimes faint after medical procedures, including vaccination. Tell your provider if you feel dizzy or have vision changes or ringing in the ears.  As with any medicine, there is a very remote chance of a vaccine causing a severe allergic  reaction, other serious injury, or death. 5. What if there is a serious problem? An allergic reaction could occur after the vaccinated person leaves the clinic. If you see signs of a severe allergic reaction (hives,  swelling of the face and throat, difficulty breathing, a fast heartbeat, dizziness, or weakness), call 9-1-1 and get the person to the nearest hospital. For other signs that concern you, call your health care provider.  Adverse reactions should be reported to the Vaccine Adverse Event Reporting System (VAERS). Your health care provider will usually file this report, or you can do it yourself. Visit the VAERS website at www.vaers.SamedayNews.es or call (562)791-6571. VAERS is only for reporting reactions, and VAERS staff do not give medical advice. 6. The National Vaccine Injury Compensation Program The [employer] Vaccine Injury Compensation Program (VICP) is a federal program that was created to compensate people who may have been injured by certain vaccines. Visit the VICP website at GoldCloset.com.ee or call (504) 267-6702 to learn about the program and about filing a claim. There is a time limit to file a claim for compensation. 7. How can I learn more?  Ask your health care provider.  Call your local or state health department.  Contact the Centers for Disease Control and Prevention (CDC): ? Call 561-726-6269 (1-800-CDC-INFO) or ? Visit CDC's website at http://hunter.com/ Vaccine Information Statement Tdap (Tetanus, Diphtheria, Pertussis) Vaccine (10/09/2018) This information is not intended to replace advice given to you by your health care provider. Make sure you discuss any questions you have with your health care provider. Document Revised: 10/18/2018 Document Reviewed: 10/21/2018 Elsevier Patient Education  [place].

## 2020-04-20 LAB — LIPID PANEL
Chol/HDL Ratio: 4.7 ratio (ref 0.0–5.0)
Cholesterol, Total: 223 mg/dL — ABNORMAL HIGH (ref 100–199)
HDL: 47 mg/dL (ref >39)
LDL Chol Calc (NIH): 151 mg/dL — ABNORMAL HIGH (ref 0–99)
Triglycerides: 139 mg/dL (ref 0–149)
VLDL Cholesterol Cal: 25 mg/dL (ref 5–40)

## 2020-04-20 LAB — COMPREHENSIVE METABOLIC PANEL
ALT: 32 IU/L (ref 0–44)
AST: 21 IU/L (ref 0–40)
Albumin/Globulin Ratio: 2.1 (ref 1.2–2.2)
Albumin: 4.7 g/dL (ref 3.8–4.8)
Alkaline Phosphatase: 55 IU/L (ref 44–121)
BUN/Creatinine Ratio: 11 (ref 10–24)
BUN: 11 mg/dL (ref 8–27)
Bilirubin Total: 0.9 mg/dL (ref 0.0–1.2)
CO2: 24 mmol/L (ref 20–29)
Calcium: 9.5 mg/dL (ref 8.6–10.2)
Chloride: 100 mmol/L (ref 96–106)
Creatinine, Ser: 1.03 mg/dL (ref 0.76–1.27)
GFR calc Af Amer: 90 mL/min/{1.73_m2} (ref >59)
GFR calc non Af Amer: 78 mL/min/{1.73_m2} (ref >59)
Globulin, Total: 2.2 g/dL (ref 1.5–4.5)
Glucose: 107 mg/dL — ABNORMAL HIGH (ref 65–99)
Potassium: 4.7 mmol/L (ref 3.5–5.2)
Sodium: 137 mmol/L (ref 134–144)
Total Protein: 6.9 g/dL (ref 6.0–8.5)

## 2020-04-20 LAB — CARDIOVASCULAR RISK ASSESSMENT

## 2020-04-20 LAB — CBC WITH DIFFERENTIAL/PLATELET
Basophils Absolute: 0.1 10*3/uL (ref 0.0–0.2)
Basos: 1 %
EOS (ABSOLUTE): 0.1 10*3/uL (ref 0.0–0.4)
Eos: 1 %
Hematocrit: 52.7 % — ABNORMAL HIGH (ref 37.5–51.0)
Hemoglobin: 18.3 g/dL — ABNORMAL HIGH (ref 13.0–17.7)
Immature Grans (Abs): 0 10*3/uL (ref 0.0–0.1)
Immature Granulocytes: 0 %
Lymphocytes Absolute: 1.6 10*3/uL (ref 0.7–3.1)
Lymphs: 30 %
MCH: 32.8 pg (ref 26.6–33.0)
MCHC: 34.7 g/dL (ref 31.5–35.7)
MCV: 94 fL (ref 79–97)
Monocytes Absolute: 0.4 10*3/uL (ref 0.1–0.9)
Monocytes: 8 %
Neutrophils Absolute: 3.1 10*3/uL (ref 1.4–7.0)
Neutrophils: 60 %
Platelets: 228 10*3/uL (ref 150–450)
RBC: 5.58 x10E6/uL (ref 4.14–5.80)
RDW: 12.1 % (ref 11.6–15.4)
WBC: 5.2 10*3/uL (ref 3.4–10.8)

## 2020-04-20 LAB — TESTOSTERONE,FREE AND TOTAL
Testosterone, Free: 4.9 pg/mL — ABNORMAL LOW (ref 6.6–18.1)
Testosterone: 98 ng/dL — ABNORMAL LOW (ref 264–916)

## 2020-04-20 LAB — TSH: TSH: 2.83 u[IU]/mL (ref 0.450–4.500)

## 2020-04-20 LAB — PSA: Prostate Specific Ag, Serum: 0.5 ng/mL (ref 0.0–4.0)

## 2020-04-21 ENCOUNTER — Other Ambulatory Visit: Payer: Self-pay | Admitting: Nurse Practitioner

## 2020-04-21 DIAGNOSIS — E782 Mixed hyperlipidemia: Secondary | ICD-10-CM

## 2020-04-21 MED ORDER — ROSUVASTATIN CALCIUM 5 MG PO TABS: 5.0000 mg | ORAL_TABLET | Freq: Every day | ORAL | 0 refills | Status: DC

## 2020-04-21 NOTE — Progress Notes (Signed)
Testosterone level low at 98. Pt currently receiving testosterone replacement. Please assess if he is taking it as prescribed.  Blood glucose elevated at 107. Please add HgbA1C. Lipid panel elevated (TC 223, LDL 151, Trig 139, HDL 47). Please resume Crestor 5mg  QHS.

## 2020-04-21 NOTE — Progress Notes (Signed)
Sent Crestor 5mg  QHS to pharmacy for elevated cholesterol. Patient needs a 46-month follow-up appointment for fasting lipid panel please

## 2020-04-23 ENCOUNTER — Other Ambulatory Visit: Payer: Self-pay | Admitting: Nurse Practitioner

## 2020-04-23 ENCOUNTER — Other Ambulatory Visit: Payer: Self-pay | Admitting: Family Medicine

## 2020-04-23 ENCOUNTER — Encounter: Payer: Self-pay | Admitting: Nurse Practitioner

## 2020-04-23 ENCOUNTER — Other Ambulatory Visit: Payer: Self-pay

## 2020-04-23 DIAGNOSIS — E291 Testicular hypofunction: Secondary | ICD-10-CM

## 2020-04-23 MED ORDER — TESTOSTERONE CYPIONATE 200 MG/ML IM SOLN: 200.0000 mg | INTRAMUSCULAR | 0 refills | Status: DC

## 2020-04-23 NOTE — Progress Notes (Signed)
Patient to return in 3 months for fasting lipid panel and testosterone level. Resume Crestor 5mg  daily

## 2020-04-28 DIAGNOSIS — H26491 Other secondary cataract, right eye: Secondary | ICD-10-CM | POA: Diagnosis not present

## 2020-04-29 ENCOUNTER — Ambulatory Visit: Payer: BC Managed Care – PPO

## 2020-05-04 ENCOUNTER — Ambulatory Visit (INDEPENDENT_AMBULATORY_CARE_PROVIDER_SITE_OTHER): Payer: BC Managed Care – PPO

## 2020-05-04 ENCOUNTER — Other Ambulatory Visit: Payer: Self-pay

## 2020-05-04 DIAGNOSIS — Z23 Encounter for immunization: Secondary | ICD-10-CM

## 2020-05-04 NOTE — Progress Notes (Signed)
   Shingrix vaccine given today, patient tolerated well. This was his first dose.  His second dose will be due in 2-6 months.  Jacklynn Bue, LPN 0:98 AM

## 2020-06-17 ENCOUNTER — Other Ambulatory Visit: Payer: Self-pay | Admitting: Family Medicine

## 2020-06-17 DIAGNOSIS — E291 Testicular hypofunction: Secondary | ICD-10-CM

## 2020-07-09 ENCOUNTER — Encounter: Payer: Self-pay | Admitting: Family Medicine

## 2020-07-13 ENCOUNTER — Ambulatory Visit (INDEPENDENT_AMBULATORY_CARE_PROVIDER_SITE_OTHER): Payer: BC Managed Care – PPO | Admitting: Family Medicine

## 2020-07-13 ENCOUNTER — Ambulatory Visit (INDEPENDENT_AMBULATORY_CARE_PROVIDER_SITE_OTHER): Payer: BC Managed Care – PPO

## 2020-07-13 VITALS — BP 128/62 | HR 69

## 2020-07-13 DIAGNOSIS — J329 Chronic sinusitis, unspecified: Secondary | ICD-10-CM | POA: Diagnosis not present

## 2020-07-13 DIAGNOSIS — R059 Cough, unspecified: Secondary | ICD-10-CM | POA: Diagnosis not present

## 2020-07-13 DIAGNOSIS — Z23 Encounter for immunization: Secondary | ICD-10-CM

## 2020-07-13 LAB — POC COVID19 BINAXNOW: SARS Coronavirus 2 Ag: NEGATIVE

## 2020-07-13 MED ORDER — DOXYCYCLINE HYCLATE 100 MG PO TABS: 100.0000 mg | ORAL_TABLET | Freq: Two times a day (BID) | ORAL | 0 refills | Status: DC

## 2020-07-13 NOTE — Progress Notes (Signed)
Acute Office Visit  Subjective:    Patient ID: James Craig, male    DOB: 10-31-1955, 65 y.o.   MRN: [id number]  CC: cough/congestion  HPI Patient is in today for cough, congestion, facial pressure and sore throat in the morning. NO fever, nausea, vomiting. Pt with h/o of otitis in childhood. PEN allergic. Pt here for shingrix vaccine. Pt suppose to take Crestor-no longer taking daily  Past Medical History:  Diagnosis Date  . Hyperlipidemia       Family History  Family history unknown: Yes    Social History   Socioeconomic History  . Marital status: Married    Spouse name: Not on file  . Number of children: Not on file  . Years of education: Not on file  . Highest education level: Not on file  Occupational History    Employer: [employer]  Tobacco Use  . Smoking status: Never Smoker  . Smokeless tobacco: Never Used  Substance and Sexual Activity  . Alcohol use: Never  . Drug use: Never  . Sexual activity: Not on file  Other Topics Concern  . Not on file  Social History Narrative  . Not on file   Social Determinants of Health   Financial Resource Strain: Not on file  Food Insecurity: Not on file  Transportation Needs: Not on file  Physical Activity: Not on file  Stress: Not on file  Social Connections: Not on file  Intimate Partner Violence: Not on file    Outpatient Medications Prior to Visit  Medication Sig Dispense Refill  . rosuvastatin (CRESTOR) 5 MG tablet Take 1 tablet (5 mg total) by mouth daily. 90 tablet 0  . testosterone cypionate (DEPOTESTOSTERONE CYPIONATE) 200 MG/ML injection INJECT (200MG ) INTRAMUSCULARLY EVERY2 WEEKS 10 mL 1   No facility-administered medications prior to visit.    Allergies  Allergen Reactions  . Penicillins Hives    Review of Systems  Constitutional: Negative for fatigue and fever.  HENT: Positive for congestion, sinus pressure and sore throat.   Eyes: Negative.   Respiratory: Positive for cough.  Negative for shortness of breath and wheezing.   Musculoskeletal: Negative.   Skin: Negative.   Allergic/Immunologic: Negative.   Neurological: Positive for dizziness and headaches.  Psychiatric/Behavioral: Negative.        Objective:    Physical Exam Constitutional:      Appearance: Normal appearance.  HENT:     Head: Normocephalic and atraumatic.     Right Ear: Tympanic membrane normal.     Left Ear: Tympanic membrane normal.     Nose: Congestion present.     Mouth/Throat:     Pharynx: Oropharyngeal exudate present.  Eyes:     Conjunctiva/sclera: Conjunctivae normal.  Cardiovascular:     Rate and Rhythm: Normal rate and regular rhythm.     Pulses: Normal pulses.     Heart sounds: Normal heart sounds.  Pulmonary:     Effort: Pulmonary effort is normal.     Breath sounds: Normal breath sounds.  Musculoskeletal:     Cervical back: Normal range of motion and neck supple.  Neurological:     Mental Status: He is alert.      Wt Readings from Last 3 Encounters:  04/15/20 251 lb (113.9 kg)  08/20/19 249 lb 12.8 oz (113.3 kg)    Health Maintenance Due  Topic Date Due  . Hepatitis C Screening  Never done  . HIV Screening  Never done  . COVID-19 Vaccine (3 - Booster  for Pfizer series) 04/08/2020    Lab Results  Component Value Date   TSH 2.830 04/15/2020   Lab Results  Component Value Date   WBC 5.2 04/15/2020   HGB 18.3 (H) 04/15/2020   HCT 52.7 (H) 04/15/2020   MCV 94 04/15/2020   PLT 228 04/15/2020   Lab Results  Component Value Date   NA 137 04/15/2020   K 4.7 04/15/2020   CO2 24 04/15/2020   GLUCOSE 107 (H) 04/15/2020   BUN 11 04/15/2020   CREATININE 1.03 04/15/2020   BILITOT 0.9 04/15/2020   ALKPHOS 55 04/15/2020   AST 21 04/15/2020   ALT 32 04/15/2020   PROT 6.9 04/15/2020   ALBUMIN 4.7 04/15/2020   CALCIUM 9.5 04/15/2020   Lab Results  Component Value Date   CHOL 223 (H) 04/15/2020   Lab Results  Component Value Date   HDL 47  04/15/2020   Lab Results  Component Value Date   LDLCALC 151 (H) 04/15/2020   Lab Results  Component Value Date   TRIG 139 04/15/2020   Lab Results  Component Value Date   CHOLHDL 4.7 04/15/2020       Assessment & Plan:   1. Cough Likely post nasal congestion due to sinuses - POC COVID-19-NEGATIVE  2. Sinusitis, unspecified chronicity, unspecified location Doxy-rx, mucinex, flonase LISA Mat Carne, MD

## 2020-07-13 NOTE — Progress Notes (Signed)
Pt in office today for second Shingrix vaccine. Signed consent. Pt received in L deltoid. Tolerated vaccine.

## 2020-07-22 DIAGNOSIS — H61303 Acquired stenosis of external ear canal, unspecified, bilateral: Secondary | ICD-10-CM | POA: Diagnosis not present

## 2020-07-22 DIAGNOSIS — H903 Sensorineural hearing loss, bilateral: Secondary | ICD-10-CM | POA: Diagnosis not present

## 2020-07-22 DIAGNOSIS — Z77122 Contact with and (suspected) exposure to noise: Secondary | ICD-10-CM | POA: Diagnosis not present

## 2020-07-22 DIAGNOSIS — H9319 Tinnitus, unspecified ear: Secondary | ICD-10-CM | POA: Diagnosis not present

## 2020-07-22 DIAGNOSIS — H6123 Impacted cerumen, bilateral: Secondary | ICD-10-CM | POA: Diagnosis not present

## 2020-08-31 ENCOUNTER — Telehealth: Payer: Self-pay

## 2020-08-31 NOTE — Telephone Encounter (Signed)
Pt called and left message. Pt stating he believes he has gout in his elbow. Requesting medication or advice.  Attempted to call pt back to schedule an appointment. Pt did not answer but did have identifying VM box. Left message requesting pt call back and schedule appointment for elbow.   Terrill Mohr 08/31/20 9:46 AM

## 2020-08-31 NOTE — Progress Notes (Signed)
Acute Office Visit  Subjective:    Patient ID: James Craig, male    DOB: 1956-02-14, 65 y.o.   MRN: [id number]  Chief Complaint  Patient presents with  . Elbow Pain    HPI Patient is in today for gout in elbow. Warm , painful. Pt tried diclofenac, without help.  Patient is also complaining that his testesterone is wearing off in about one week. He increased his last shot to 2 ml (400 mg) which worked great. That was 2 weeks ago.   Past Medical History:  Diagnosis Date  . Hyperlipidemia     History reviewed. No pertinent surgical history.  Family History  Family history unknown: Yes    Social History   Socioeconomic History  . Marital status: Married    Spouse name: Not on file  . Number of children: Not on file  . Years of education: Not on file  . Highest education level: Not on file  Occupational History    Employer: [employer]  Tobacco Use  . Smoking status: Never Smoker  . Smokeless tobacco: Never Used  Substance and Sexual Activity  . Alcohol use: Never  . Drug use: Never  . Sexual activity: Not on file  Other Topics Concern  . Not on file  Social History Narrative  . Not on file   Social Determinants of Health   Financial Resource Strain: Not on file  Food Insecurity: Not on file  Transportation Needs: Not on file  Physical Activity: Not on file  Stress: Not on file  Social Connections: Not on file  Intimate Partner Violence: Not on file    Outpatient Medications Prior to Visit  Medication Sig Dispense Refill  . rosuvastatin (CRESTOR) 5 MG tablet Take 1 tablet (5 mg total) by mouth daily. 90 tablet 0  . testosterone cypionate (DEPOTESTOSTERONE CYPIONATE) 200 MG/ML injection INJECT (200MG ) INTRAMUSCULARLY EVERY2 WEEKS 10 mL 1  . doxycycline (VIBRA-TABS) 100 MG tablet Take 1 tablet (100 mg total) by mouth 2 (two) times daily. 20 tablet 0   No facility-administered medications prior to visit.    Allergies  Allergen Reactions  .  Penicillins Hives    Review of Systems  Constitutional: Positive for fatigue. Negative for chills, fever and unexpected weight change.  HENT: Positive for rhinorrhea. Negative for congestion, ear pain, sinus pain and sore throat.   Respiratory: Negative for cough and shortness of breath.   Cardiovascular: Negative for chest pain and palpitations.  Gastrointestinal: Negative for abdominal pain, blood in stool, constipation, diarrhea, nausea and vomiting.  Endocrine: Negative for polydipsia.  Genitourinary: Negative for dysuria.  Musculoskeletal: Positive for arthralgias (left elbow pain and swelling ). Negative for back pain.  Skin: Negative for rash.  Neurological: Negative for headaches.  Psychiatric/Behavioral: Negative for dysphoric mood. The patient is not nervous/anxious.        Objective:    Physical Exam Constitutional:      Appearance: Normal appearance.  Abdominal:     General: Bowel sounds are normal.     Palpations: Abdomen is soft. There is no mass.     Tenderness: There is no abdominal tenderness.  Musculoskeletal:        General: Tenderness (left elbow. Swelling and erythema. ) present.  Neurological:     Mental Status: He is alert.  Psychiatric:        Mood and Affect: Mood normal.        Behavior: Behavior normal.     BP 128/80  Pulse 92   Temp 97.6 F (36.4 C)   Resp 18   Ht 6\' 2"  (1.88 m)   Wt 256 lb (116.1 kg)   BMI 32.87 kg/m  Wt Readings from Last 3 Encounters:  09/01/20 256 lb (116.1 kg)  04/15/20 251 lb (113.9 kg)  08/20/19 249 lb 12.8 oz (113.3 kg)    Health Maintenance Due  Topic Date Due  . Hepatitis C Screening  Never done  . HIV Screening  Never done  . COVID-19 Vaccine (3 - Booster for Pfizer series) 04/08/2020    There are no preventive care reminders to display for this patient.   Lab Results  Component Value Date   TSH 2.830 04/15/2020   Lab Results  Component Value Date   WBC 5.2 04/15/2020   HGB 18.3 (H)  04/15/2020   HCT 52.7 (H) 04/15/2020   MCV 94 04/15/2020   PLT 228 04/15/2020   Lab Results  Component Value Date   NA 137 04/15/2020   K 4.7 04/15/2020   CO2 24 04/15/2020   GLUCOSE 107 (H) 04/15/2020   BUN 11 04/15/2020   CREATININE 1.03 04/15/2020   BILITOT 0.9 04/15/2020   ALKPHOS 55 04/15/2020   AST 21 04/15/2020   ALT 32 04/15/2020   PROT 6.9 04/15/2020   ALBUMIN 4.7 04/15/2020   CALCIUM 9.5 04/15/2020   Lab Results  Component Value Date   CHOL 223 (H) 04/15/2020   Lab Results  Component Value Date   HDL 47 04/15/2020   Lab Results  Component Value Date   LDLCALC 151 (H) 04/15/2020   Lab Results  Component Value Date   TRIG 139 04/15/2020   Lab Results  Component Value Date   CHOLHDL 4.7 04/15/2020   No results found for: HGBA1C     Assessment & Plan:  1. Testicular hypofunction - Testosterone,Free and Total  2. Acute idiopathic gout of left elbow - Colchicine (MITIGARE) 0.6 MG CAPS; Take 1 capsule by mouth in the morning and at bedtime for 7 days.  Dispense: 30 capsule; Refill: 1 - CBC with Differential/Platelet - Uric acid    Meds ordered this encounter  Medications  . Colchicine (MITIGARE) 0.6 MG CAPS    Sig: Take 1 capsule by mouth in the morning and at bedtime for 7 days.    Dispense:  30 capsule    Refill:  1    Orders Placed This Encounter  Procedures  . Testosterone,Free and Total  . CBC with Differential/Platelet  . Uric acid     Follow-up: Return if symptoms worsen or fail to improve.  An After Visit Summary was printed and given to the patient.  Blane Ohara, MD [facility] [phone]

## 2020-09-01 ENCOUNTER — Ambulatory Visit (INDEPENDENT_AMBULATORY_CARE_PROVIDER_SITE_OTHER): Payer: BC Managed Care – PPO | Admitting: Family Medicine

## 2020-09-01 ENCOUNTER — Other Ambulatory Visit: Payer: Self-pay

## 2020-09-01 ENCOUNTER — Encounter: Payer: Self-pay | Admitting: Family Medicine

## 2020-09-01 VITALS — BP 128/80 | HR 92 | Temp 97.6°F | Resp 18 | Ht 74.0 in | Wt 256.0 lb

## 2020-09-01 DIAGNOSIS — M10022 Idiopathic gout, left elbow: Secondary | ICD-10-CM

## 2020-09-01 DIAGNOSIS — E291 Testicular hypofunction: Secondary | ICD-10-CM | POA: Diagnosis not present

## 2020-09-01 MED ORDER — COLCHICINE 0.6 MG PO CAPS: 1.0000 | ORAL_CAPSULE | Freq: Two times a day (BID) | ORAL | 1 refills | Status: DC

## 2020-09-03 LAB — CBC WITH DIFFERENTIAL/PLATELET
Basophils Absolute: 0.1 10*3/uL (ref 0.0–0.2)
Basos: 1 %
EOS (ABSOLUTE): 0.1 10*3/uL (ref 0.0–0.4)
Eos: 2 %
Hematocrit: 52.7 % — ABNORMAL HIGH (ref 37.5–51.0)
Hemoglobin: 17.9 g/dL — ABNORMAL HIGH (ref 13.0–17.7)
Immature Grans (Abs): 0 10*3/uL (ref 0.0–0.1)
Immature Granulocytes: 0 %
Lymphocytes Absolute: 1.8 10*3/uL (ref 0.7–3.1)
Lymphs: 31 %
MCH: 31.4 pg (ref 26.6–33.0)
MCHC: 34 g/dL (ref 31.5–35.7)
MCV: 93 fL (ref 79–97)
Monocytes Absolute: 0.6 10*3/uL (ref 0.1–0.9)
Monocytes: 9 %
Neutrophils Absolute: 3.4 10*3/uL (ref 1.4–7.0)
Neutrophils: 57 %
Platelets: 216 10*3/uL (ref 150–450)
RBC: 5.7 x10E6/uL (ref 4.14–5.80)
RDW: 12.3 % (ref 11.6–15.4)
WBC: 5.9 10*3/uL (ref 3.4–10.8)

## 2020-09-03 LAB — TESTOSTERONE,FREE AND TOTAL
Testosterone, Free: 10.9 pg/mL (ref 6.6–18.1)
Testosterone: 247 ng/dL — ABNORMAL LOW (ref 264–916)

## 2020-09-03 LAB — URIC ACID: Uric Acid: 7.3 mg/dL (ref 3.8–8.4)

## 2020-09-07 ENCOUNTER — Other Ambulatory Visit: Payer: Self-pay

## 2020-09-07 DIAGNOSIS — E291 Testicular hypofunction: Secondary | ICD-10-CM

## 2020-09-07 MED ORDER — TESTOSTERONE CYPIONATE 200 MG/ML IM SOLN: 250.0000 mg | INTRAMUSCULAR | 1 refills | Status: DC

## 2020-09-20 ENCOUNTER — Telehealth (INDEPENDENT_AMBULATORY_CARE_PROVIDER_SITE_OTHER): Payer: BC Managed Care – PPO | Admitting: Family Medicine

## 2020-09-20 VITALS — BP 138/78 | HR 72 | Temp 97.2°F | Ht 74.0 in | Wt 251.0 lb

## 2020-09-20 DIAGNOSIS — J018 Other acute sinusitis: Secondary | ICD-10-CM

## 2020-09-20 DIAGNOSIS — J301 Allergic rhinitis due to pollen: Secondary | ICD-10-CM

## 2020-09-20 MED ORDER — CEFDINIR 300 MG PO CAPS: 300.0000 mg | ORAL_CAPSULE | Freq: Two times a day (BID) | ORAL | 0 refills | Status: DC

## 2020-09-20 MED ORDER — TRIAMCINOLONE ACETONIDE 40 MG/ML IJ SUSP
80.0000 mg | Freq: Once | INTRAMUSCULAR | Status: AC
  Administered 2020-09-20: 80 mg via INTRAMUSCULAR

## 2020-09-20 NOTE — Progress Notes (Signed)
Date:  09/26/2020   ID:  James Craig, DOB 1956-07-06, MRN [id number]  PCP:  Blane Ohara, MD   CC: sinus congestion  History of Present Illness:    James Craig is a 65 y.o. male with sinusitis x 2-3 weeks. Has tried mucinex and otc allergy medication. NO fever, chills, sore throat, or earaches.  The patient does not have symptoms concerning for COVID-19 infection (fever, chills, cough, or new shortness of breath).    Past Medical History:  Diagnosis Date  . Hyperlipidemia     History reviewed. No pertinent surgical history.  Family History  Family history unknown: Yes    Social History   Socioeconomic History  . Marital status: Married    Spouse name: Not on file  . Number of children: Not on file  . Years of education: Not on file  . Highest education level: Not on file  Occupational History    Employer: [employer]  Tobacco Use  . Smoking status: Never Smoker  . Smokeless tobacco: Never Used  Substance and Sexual Activity  . Alcohol use: Never  . Drug use: Never  . Sexual activity: Not on file  Other Topics Concern  . Not on file  Social History Narrative  . Not on file   Social Determinants of Health   Financial Resource Strain: Not on file  Food Insecurity: Not on file  Transportation Needs: Not on file  Physical Activity: Not on file  Stress: Not on file  Social Connections: Not on file  Intimate Partner Violence: Not on file    Outpatient Medications Prior to Visit  Medication Sig Dispense Refill  . Colchicine (MITIGARE) 0.6 MG CAPS Take 1 capsule by mouth in the morning and at bedtime for 7 days. 30 capsule 1  . rosuvastatin (CRESTOR) 5 MG tablet Take 1 tablet (5 mg total) by mouth daily. 90 tablet 0  . testosterone cypionate (DEPOTESTOSTERONE CYPIONATE) 200 MG/ML injection Inject 1.25 mLs (250 mg total) into the muscle every 14 (fourteen) days. 10 mL 1   No facility-administered medications prior to visit.    Allergies:    Penicillins   Social History   Tobacco Use  . Smoking status: Never Smoker  . Smokeless tobacco: Never Used  Substance Use Topics  . Alcohol use: Never  . Drug use: Never     Review of Systems  Constitutional: Positive for diaphoresis. Negative for chills and fever.  HENT: Positive for congestion and sinus pain. Negative for sore throat.        Rhinorrhea  Respiratory: Negative for cough and shortness of breath.   Cardiovascular: Negative for chest pain.  Gastrointestinal: Negative for abdominal pain, constipation, diarrhea, nausea and vomiting.  Musculoskeletal: Negative for myalgias.  Neurological: Positive for headaches. Negative for dizziness.     Labs/Other Tests and Data Reviewed:    Recent Labs: 04/15/2020: ALT 32; BUN 11; Creatinine, Ser 1.03; Potassium 4.7; Sodium 137; TSH 2.830 09/01/2020: Hemoglobin 17.9; Platelets 216   Recent Lipid Panel Lab Results  Component Value Date/Time   CHOL 223 (H) 04/15/2020 10:45 AM   TRIG 139 04/15/2020 10:45 AM   HDL 47 04/15/2020 10:45 AM   CHOLHDL 4.7 04/15/2020 10:45 AM   LDLCALC 151 (H) 04/15/2020 10:45 AM    Wt Readings from Last 3 Encounters:  09/20/20 251 lb (113.9 kg)  09/01/20 256 lb (116.1 kg)  04/15/20 251 lb (113.9 kg)     Objective:    Vital Signs:  BP 138/78  Pulse 72   Temp (!) 97.2 F (36.2 C)   Ht 6\' 2"  (1.88 m)   Wt 251 lb (113.9 kg)   SpO2 98%   BMI 32.23 kg/m    Physical Exam Constitutional:      Appearance: Normal appearance.  HENT:     Right Ear: Tympanic membrane, ear canal and external ear normal.     Left Ear: Tympanic membrane, ear canal and external ear normal.     Nose: Congestion present. No rhinorrhea.     Comments: BL sinus tenderness.    Mouth/Throat:     Mouth: Mucous membranes are moist.     Pharynx: No oropharyngeal exudate or posterior oropharyngeal erythema.  Cardiovascular:     Rate and Rhythm: Normal rate and regular rhythm.     Heart sounds: Normal heart sounds.   Pulmonary:     Effort: Pulmonary effort is normal. No respiratory distress.     Breath sounds: Normal breath sounds. No wheezing, rhonchi or rales.  Lymphadenopathy:     Cervical: No cervical adenopathy.  Neurological:     Mental Status: He is alert.  Psychiatric:        Mood and Affect: Mood normal.        Behavior: Behavior normal.      ASSESSMENT & PLAN:   1. Acute non-recurrent sinusitis of other sinus Continue cefdinir.  Steroid shot  - cefdinir (OMNICEF) 300 MG capsule; Take 1 capsule (300 mg total) by mouth 2 (two) times daily.  Dispense: 20 capsule; Refill: 0 - triamcinolone acetonide (KENALOG-40) injection 80 mg  2. Seasonal allergic rhinitis due to pollen Steroid shot given.  - triamcinolone acetonide (KENALOG-40) injection 80 mg   Meds ordered this encounter  Medications  . cefdinir (OMNICEF) 300 MG capsule    Sig: Take 1 capsule (300 mg total) by mouth 2 (two) times daily.    Dispense:  20 capsule    Refill:  0  . triamcinolone acetonide (KENALOG-40) injection 80 mg     Follow Up:  In Person prn  Signed,  Blane Ohara, MD  09/26/2020 12:38 AM    [facility] [place]

## 2020-09-22 DIAGNOSIS — Z85828 Personal history of other malignant neoplasm of skin: Secondary | ICD-10-CM | POA: Diagnosis not present

## 2020-09-22 DIAGNOSIS — D1801 Hemangioma of skin and subcutaneous tissue: Secondary | ICD-10-CM | POA: Diagnosis not present

## 2020-09-22 DIAGNOSIS — L570 Actinic keratosis: Secondary | ICD-10-CM | POA: Diagnosis not present

## 2020-09-22 DIAGNOSIS — D225 Melanocytic nevi of trunk: Secondary | ICD-10-CM | POA: Diagnosis not present

## 2020-09-22 DIAGNOSIS — D2261 Melanocytic nevi of right upper limb, including shoulder: Secondary | ICD-10-CM | POA: Diagnosis not present

## 2020-09-26 ENCOUNTER — Encounter: Payer: Self-pay | Admitting: Family Medicine

## 2020-12-20 ENCOUNTER — Other Ambulatory Visit: Payer: Self-pay

## 2020-12-20 DIAGNOSIS — E291 Testicular hypofunction: Secondary | ICD-10-CM

## 2020-12-21 ENCOUNTER — Other Ambulatory Visit: Payer: Self-pay

## 2020-12-21 ENCOUNTER — Ambulatory Visit: Payer: BC Managed Care – PPO

## 2020-12-21 DIAGNOSIS — E291 Testicular hypofunction: Secondary | ICD-10-CM | POA: Diagnosis not present

## 2020-12-22 LAB — TESTOSTERONE: Testosterone: 108 ng/dL — ABNORMAL LOW (ref 264–916)

## 2021-01-06 ENCOUNTER — Ambulatory Visit (INDEPENDENT_AMBULATORY_CARE_PROVIDER_SITE_OTHER): Payer: BC Managed Care – PPO | Admitting: Family Medicine

## 2021-01-06 ENCOUNTER — Other Ambulatory Visit: Payer: Self-pay

## 2021-01-06 VITALS — BP 130/76 | HR 93 | Temp 97.3°F | Ht 74.0 in | Wt 255.0 lb

## 2021-01-06 DIAGNOSIS — E782 Mixed hyperlipidemia: Secondary | ICD-10-CM | POA: Diagnosis not present

## 2021-01-06 DIAGNOSIS — E291 Testicular hypofunction: Secondary | ICD-10-CM | POA: Diagnosis not present

## 2021-01-06 DIAGNOSIS — J018 Other acute sinusitis: Secondary | ICD-10-CM | POA: Diagnosis not present

## 2021-01-06 DIAGNOSIS — G4709 Other insomnia: Secondary | ICD-10-CM | POA: Diagnosis not present

## 2021-01-06 MED ORDER — CLARITHROMYCIN ER 500 MG PO TB24: 1000.0000 mg | ORAL_TABLET | Freq: Every day | ORAL | 0 refills | Status: DC

## 2021-01-06 MED ORDER — FLUTICASONE PROPIONATE 50 MCG/ACT NA SUSP: 2.0000 | Freq: Every day | NASAL | 6 refills | Status: AC

## 2021-01-06 MED ORDER — TESTOSTERONE CYPIONATE 200 MG/ML IM SOLN: 300.0000 mg | INTRAMUSCULAR | 1 refills | Status: DC

## 2021-01-06 NOTE — Progress Notes (Signed)
Subjective:  Patient ID: James Craig, male    DOB: 04/09/1956  Age: 65 y.o. MRN: [id number]  Chief Complaint  Patient presents with   Insomnia     HPI Insomnia-unable to sleep for the past 2 weeks, feel congested at night and fatigued in the a.m. no new medications.   Testoterone-recheck level was low 108. Requesting recheck today.   Hyperlipidemia: LDL elevated. Has not been taking crestor regularly.   Current Outpatient Medications on File Prior to Visit  Medication Sig Dispense Refill   Colchicine (MITIGARE) 0.6 MG CAPS Take 1 capsule by mouth in the morning and at bedtime for 7 days. 30 capsule 1   No current facility-administered medications on file prior to visit.   Past Medical History:  Diagnosis Date   Hyperlipidemia    No past surgical history on file.  Family History  Family history unknown: Yes   Social History   Socioeconomic History   Marital status: Married    Spouse name: Not on file   Number of children: Not on file   Years of education: Not on file   Highest education level: Not on file  Occupational History    Employer: [employer]  Tobacco Use   Smoking status: Never   Smokeless tobacco: Never  Substance and Sexual Activity   Alcohol use: Never   Drug use: Never   Sexual activity: Not on file  Other Topics Concern   Not on file  Social History Narrative   Not on file   Social Determinants of Health   Financial Resource Strain: Not on file  Food Insecurity: Not on file  Transportation Needs: Not on file  Physical Activity: Not on file  Stress: Not on file  Social Connections: Not on file    Review of Systems  Constitutional:  Positive for fatigue. Negative for chills and fever.  HENT:  Positive for congestion. Negative for ear pain, rhinorrhea and sore throat.   Respiratory:  Negative for cough and shortness of breath.   Cardiovascular:  Negative for chest pain.  Psychiatric/Behavioral:  Positive for sleep disturbance.  Negative for dysphoric mood. The patient is nervous/anxious.     Objective:  BP 130/76   Pulse 93   Temp (!) 97.3 F (36.3 C)   Ht 6\' 2"  (1.88 m)   Wt 255 lb (115.7 kg)   SpO2 100%   BMI 32.74 kg/m   BP/Weight 01/19/2021 01/06/2021 09/20/2020  Systolic BP 132 130 138  Diastolic BP 78 76 78  Wt. (Lbs) 250 255 251  BMI 32.1 32.74 32.23    Physical Exam Vitals reviewed.  Constitutional:      Appearance: Normal appearance.  HENT:     Right Ear: Tympanic membrane normal.     Left Ear: Tympanic membrane normal.     Nose: Congestion present.     Comments: Sinus tenderness    Mouth/Throat:     Pharynx: No oropharyngeal exudate or posterior oropharyngeal erythema.  Neck:     Vascular: No carotid bruit.  Cardiovascular:     Rate and Rhythm: Normal rate and regular rhythm.     Heart sounds: Normal heart sounds.  Pulmonary:     Effort: Pulmonary effort is normal.     Breath sounds: Normal breath sounds. No wheezing, rhonchi or rales.  Abdominal:     General: Bowel sounds are normal.     Palpations: Abdomen is soft.     Tenderness: There is no abdominal tenderness.  Neurological:  Mental Status: He is alert.  Psychiatric:        Mood and Affect: Mood normal.        Behavior: Behavior normal.    Diabetic Foot Exam - Simple   No data filed      Lab Results  Component Value Date   WBC 5.9 09/01/2020   HGB 17.9 (H) 09/01/2020   HCT 52.7 (H) 09/01/2020   PLT 216 09/01/2020   GLUCOSE 107 (H) 04/15/2020   CHOL 223 (H) 04/15/2020   TRIG 139 04/15/2020   HDL 47 04/15/2020   LDLCALC 151 (H) 04/15/2020   ALT 32 04/15/2020   AST 21 04/15/2020   NA 137 04/15/2020   K 4.7 04/15/2020   CL 100 04/15/2020   CREATININE 1.03 04/15/2020   BUN 11 04/15/2020   CO2 24 04/15/2020   TSH 2.830 04/15/2020      Assessment & Plan:   1. Acute non-recurrent sinusitis of other sinus 2. Testicular hypofunction 3. Other insomnia 4. Mixed hyperlipidemia   Plan: Increase  testosterone to 300 mg (1.5 ml) every 2 weeks.  Sinusitis:  Clarithromycin xr once daily x 7 days. Flonase sent.  May get sudafed otc (you have to ask the pharmacist for it) to take also.  Restart crestor 5 mg one daily.  Recheck labs fasting in 6 weeks.  If you are unable to sleep, recommend add melatonin. Hopefully treating your sinus infection will help sleep.   Follow-up: Return in about 6 weeks (around 02/17/2021) for fasting.  An After Visit Summary was printed and given to the patient.  Blane Ohara, MD [facility] [phone]

## 2021-01-06 NOTE — Patient Instructions (Signed)
Increase testosterone to 300 mg (1.5 ml) every 2 weeks.  Sinusitis:  Clarithromycin xr once daily x 7 days. Flonase sent.  May get sudafed otc (you have to ask the pharmacist for it) to take also.  Restart crestor 5 mg one daily.  Recheck labs fasting in 6 weeks.  If you are unable to sleep, recommend add melatonin. Hopefully treating your sinus infection will help sleep.

## 2021-01-15 ENCOUNTER — Other Ambulatory Visit: Payer: Self-pay | Admitting: Nurse Practitioner

## 2021-01-15 DIAGNOSIS — E782 Mixed hyperlipidemia: Secondary | ICD-10-CM

## 2021-01-17 ENCOUNTER — Ambulatory Visit: Payer: BC Managed Care – PPO | Admitting: Family Medicine

## 2021-01-19 ENCOUNTER — Encounter: Payer: Self-pay | Admitting: Nurse Practitioner

## 2021-01-19 ENCOUNTER — Ambulatory Visit (INDEPENDENT_AMBULATORY_CARE_PROVIDER_SITE_OTHER): Payer: BC Managed Care – PPO | Admitting: Nurse Practitioner

## 2021-01-19 VITALS — BP 132/78 | HR 77 | Temp 98.0°F | Ht 74.0 in | Wt 250.0 lb

## 2021-01-19 DIAGNOSIS — J029 Acute pharyngitis, unspecified: Secondary | ICD-10-CM

## 2021-01-19 DIAGNOSIS — R059 Cough, unspecified: Secondary | ICD-10-CM

## 2021-01-19 DIAGNOSIS — J018 Other acute sinusitis: Secondary | ICD-10-CM | POA: Diagnosis not present

## 2021-01-19 DIAGNOSIS — G4709 Other insomnia: Secondary | ICD-10-CM

## 2021-01-19 DIAGNOSIS — H6693 Otitis media, unspecified, bilateral: Secondary | ICD-10-CM

## 2021-01-19 LAB — POC COVID19 BINAXNOW: SARS Coronavirus 2 Ag: NEGATIVE

## 2021-01-19 LAB — POCT RAPID STREP A (OFFICE): Rapid Strep A Screen: NEGATIVE

## 2021-01-19 MED ORDER — TRAZODONE HCL 50 MG PO TABS: 25.0000 mg | ORAL_TABLET | Freq: Every evening | ORAL | 3 refills | Status: DC | PRN

## 2021-01-19 MED ORDER — AZITHROMYCIN 250 MG PO TABS: ORAL_TABLET | ORAL | 0 refills | Status: AC

## 2021-01-19 MED ORDER — BENZONATATE 100 MG PO CAPS
100.0000 mg | ORAL_CAPSULE | Freq: Three times a day (TID) | ORAL | 0 refills | Status: DC | PRN
Start: 2021-01-19 — End: 2021-02-16

## 2021-01-19 NOTE — Progress Notes (Signed)
Acute Office Visit  Subjective:    Patient ID: James Craig, male    DOB: [birth year], 65 y.o.   MRN: [id number]  Chief Complaint  Patient presents with   Cough    ST/Congestion/Chest and head    HPI James Craig is a 65 year old Caucasian male that presents with sore throats, sinus congestion, post-nasal-drip, and cough. Onset of symptoms was 3-weeks-ago. Treatment includes Sudafed, Flonase nasal spray, Chloraseptic spray, and warm salt water gargles. He stats COVID-19 home test was negative 4-days-ago. He has a past history of chronic allergic rhinitis.  James Craig. Treatment has included melatonin 10 mg. He does admit to drinking 32 ounces of coffee with caffeine.    Past Medical History:  Diagnosis Date   Hyperlipidemia     History reviewed. No pertinent surgical history.  Family History  Family history unknown: Yes    Social History   Socioeconomic History   Marital status: Married    Spouse name: Not on file   Number of children: Not on file   Years of education: Not on file   Highest education level: Not on file  Occupational History    Employer: [employer]  Tobacco Use   Smoking status: Never   Smokeless tobacco: Never  Substance and Sexual Activity   Alcohol use: Never   Drug use: Never   Sexual activity: Not on file  Other Topics Concern   Not on file  Social History Narrative   Not on file   Social Determinants of Health   Financial Resource Strain: Not on file  Food Insecurity: Not on file  Transportation Needs: Not on file  Physical Activity: Not on file  Stress: Not on file  Social Connections: Not on file  Intimate Partner Violence: Not on file    Outpatient Medications Prior to Visit  Medication Sig Dispense Refill   fluticasone (FLONASE) 50 MCG/ACT nasal spray Place 2 sprays into both nostrils daily. 16 g 6   rosuvastatin (CRESTOR) 5 MG tablet TAKE ONE TABLET BY MOUTH EVERY DAY 90 tablet  0   testosterone cypionate (DEPOTESTOSTERONE CYPIONATE) 200 MG/ML injection Inject 1.5 mLs (300 mg total) into the muscle every 14 (fourteen) days. 10 mL 1   Colchicine (MITIGARE) 0.6 MG CAPS Take 1 capsule by mouth in the morning and at bedtime for 7 days. 30 capsule 1   clarithromycin (BIAXIN XL) 500 MG 24 hr tablet Take 2 tablets (1,000 mg total) by mouth daily. 7 tablet 0   No facility-administered medications prior to visit.    Allergies  Allergen Reactions   Penicillins Hives    Review of Systems  Constitutional:  Positive for fever (Night sweats). Negative for chills and fatigue.  HENT:  Positive for congestion and sore throat. Negative for ear pain.   Eyes: Negative.   Respiratory:  Positive for cough. Negative for shortness of breath.   Cardiovascular:  Negative for chest pain and leg swelling.  Gastrointestinal:  Negative for abdominal pain, constipation, diarrhea, nausea and vomiting.  Endocrine: Negative.   Genitourinary:  Negative for dysuria and frequency.  Musculoskeletal:  Negative for arthralgias and myalgias.  Skin: Negative.   Allergic/Immunologic: Positive for environmental allergies.  Neurological:  Negative for dizziness and headaches.  Hematological: Negative.   Psychiatric/Behavioral:  Positive for sleep disturbance. Negative for dysphoric mood. The patient is not nervous/anxious.       Objective:    Physical Exam Vitals reviewed.  Constitutional:  Appearance: Normal appearance.  HENT:     Right Ear: External ear normal. Swelling and tenderness present. Tympanic membrane is erythematous.     Left Ear: External ear normal. Swelling and tenderness present. Tympanic membrane is erythematous.     Nose: Congestion and rhinorrhea present.     Right Sinus: Frontal sinus tenderness present.     Left Sinus: Frontal sinus tenderness present.     Mouth/Throat:     Mouth: Mucous membranes are moist.     Pharynx: Posterior oropharyngeal erythema present.   Eyes:     Pupils: Pupils are equal, round, and reactive to light.  Cardiovascular:     Rate and Rhythm: Normal rate and regular rhythm.     Pulses: Normal pulses.     Heart sounds: Normal heart sounds.  Pulmonary:     Effort: Pulmonary effort is normal.     Breath sounds: Normal breath sounds.  Abdominal:     General: Bowel sounds are normal.     Palpations: Abdomen is soft.  Musculoskeletal:        General: Normal range of motion.     Cervical back: Normal range of motion.  Skin:    General: Skin is warm and dry.     Capillary Refill: Capillary refill takes less than 2 seconds.  Neurological:     General: No focal deficit present.     Mental Status: He is alert and oriented to person, place, and time.  Psychiatric:        Mood and Affect: Mood normal.        Behavior: Behavior normal.    BP 132/78 (BP Location: Left Arm, Patient Position: Sitting)   Pulse 77   Temp 98 F (36.7 C) (Temporal)   Ht 6\' 2"  (1.88 m)   Wt 250 lb (113.4 kg)   SpO2 97%   BMI 32.10 kg/m  Wt Readings from Last 3 Encounters:  01/19/21 250 lb (113.4 kg)  01/06/21 255 lb (115.7 kg)  09/20/20 251 lb (113.9 kg)    Health Maintenance Due  Topic Date Due   HIV Screening  Never done   Hepatitis C Screening  Never done   COVID-19 Vaccine (3 - Booster for Pfizer series) 03/08/2020       Lab Results  Component Value Date   TSH 2.830 04/15/2020   Lab Results  Component Value Date   WBC 5.9 09/01/2020   HGB 17.9 (H) 09/01/2020   HCT 52.7 (H) 09/01/2020   MCV 93 09/01/2020   PLT 216 09/01/2020   Lab Results  Component Value Date   NA 137 04/15/2020   K 4.7 04/15/2020   CO2 24 04/15/2020   GLUCOSE 107 (H) 04/15/2020   BUN 11 04/15/2020   CREATININE 1.03 04/15/2020   BILITOT 0.9 04/15/2020   ALKPHOS 55 04/15/2020   AST 21 04/15/2020   ALT 32 04/15/2020   PROT 6.9 04/15/2020   ALBUMIN 4.7 04/15/2020   CALCIUM 9.5 04/15/2020   Lab Results  Component Value Date   CHOL 223 (H)  04/15/2020   Lab Results  Component Value Date   HDL 47 04/15/2020   Lab Results  Component Value Date   LDLCALC 151 (H) 04/15/2020   Lab Results  Component Value Date   TRIG 139 04/15/2020   Lab Results  Component Value Date   CHOLHDL 4.7 04/15/2020          Assessment & Plan:    1. Acute non-recurrent sinusitis of other sinus -  azithromycin (ZITHROMAX) 250 MG tablet; Take 2 tablets on day 1, then 1 tablet daily on days 2 through 5  Dispense: 6 tablet; Refill: 0 - POC COVID-19  2. Other insomnia - traZODone (DESYREL) 50 MG tablet; Take 0.5-1 tablets (25-50 mg total) by mouth at bedtime as needed for sleep.  Dispense: 30 tablet; Refill: 3  3. Cough - benzonatate (TESSALON) 100 MG capsule; Take 1 capsule (100 mg total) by mouth 3 (three) times daily as needed for cough.  Dispense: 30 capsule; Refill: 0  4. Sore throat - POCT rapid strep A  5. Acute otitis media, bilateral - azithromycin (ZITHROMAX) 250 MG tablet; Take 2 tablets on day 1, then 1 tablet daily on days 2 through 5  Dispense: 6 tablet; Refill: 0   Take Z-pack as directed Tessalon Perles for cough as needed Warm salt water gargles, throat lozenges for sore throat Ibuprofen/Tylenol as needed Take Trazadone as needed for sleep Follow-up as needed  I,Lauren M Auman,acting as a scribe for [employer], NP.,have documented all relevant documentation on the behalf of Janie Morning, NP,as directed by  Janie Morning, NP while in the presence of Janie Morning, NP.   I, Janie Morning, NP, have reviewed all documentation for this visit. The documentation on 01/19/21 for the exam, diagnosis, procedures, and orders are all accurate and complete.   Follow-up: as needed  Signed,  Fredrik Rigger

## 2021-01-19 NOTE — Patient Instructions (Addendum)
Take Z-pack as directed Tessalon Perles for cough as needed Warm salt water gargles, throat lozenges for sore throat Ibuprofen/Tylenol as needed Take Trazadone as needed for sleep Follow-up as needed  Sinusitis, Adult Sinusitis is soreness and swelling (inflammation) of your sinuses. Sinuses are hollow spaces in the bones around your face. They are located: Around your eyes. In the middle of your forehead. Behind your nose. In your cheekbones. Your sinuses and nasal passages are lined with a fluid called mucus. Mucus drains out of your sinuses. Swelling can trap mucus in your sinuses. This lets germs (bacteria, virus, or fungus) grow, which leads to infection. Most of the time, this condition is caused bya virus. What are the causes? This condition is caused by: Allergies. Asthma. Germs. Things that block your nose or sinuses. Growths in the nose (nasal polyps). Chemicals or irritants in the air. Fungus (rare). What increases the risk? You are more likely to develop this condition if: You have a weak body defense system (immune system). You do a lot of swimming or diving. You use nasal sprays too much. You smoke. What are the signs or symptoms? The main symptoms of this condition are pain and a feeling of pressure around the sinuses. Other symptoms include: Stuffy nose (congestion). Runny nose (drainage). Swelling and warmth in the sinuses. Headache. Toothache. A cough that may get worse at night. Mucus that collects in the throat or the back of the nose (postnasal drip). Being unable to smell and taste. Being very tired (fatigue). A fever. Sore throat. Bad breath. How is this diagnosed? This condition is diagnosed based on: Your symptoms. Your medical history. A physical exam. Tests to find out if your condition is short-term (acute) or long-term (chronic). Your doctor may: Check your nose for growths (polyps). Check your sinuses using a tool that has a light  (endoscope). Check for allergies or germs. Do imaging tests, such as an MRI or CT scan. How is this treated? Treatment for this condition depends on the cause and whether it is short-term or long-term. If caused by a virus, your symptoms should go away on their own within 10 days. You may be given medicines to relieve symptoms. They include: Medicines that shrink swollen tissue in the nose. Medicines that treat allergies (antihistamines). A spray that treats swelling of the nostrils.  Rinses that help get rid of thick mucus in your nose (nasal saline washes). If caused by bacteria, your doctor may wait to see if you will get better without treatment. You may be given antibiotic medicine if you have: A very bad infection. A weak body defense system. If caused by growths in the nose, you may need to have surgery. Follow these instructions at home: Medicines Take, use, or apply over-the-counter and prescription medicines only as told by your doctor. These may include nasal sprays. If you were prescribed an antibiotic medicine, take it as told by your doctor. Do not stop taking the antibiotic even if you start to feel better. Hydrate and humidify  Drink enough water to keep your pee (urine) pale yellow. Use a cool mist humidifier to keep the humidity level in your home above 50%. Breathe in steam for 10-15 minutes, 3-4 times a day, or as told by your doctor. You can do this in the bathroom while a hot shower is running. Try not to spend time in cool or dry air.  Rest Rest as much as you can. Sleep with your head raised (elevated). Make sure you get  enough sleep each night. General instructions  Put a warm, moist washcloth on your face 3-4 times a day, or as often as told by your doctor. This will help with discomfort. Wash your hands often with soap and water. If there is no soap and water, use hand sanitizer. Do not smoke. Avoid being around people who are smoking (secondhand  smoke). Keep all follow-up visits as told by your doctor. This is important.  Contact a doctor if: You have a fever. Your symptoms get worse. Your symptoms do not get better within 10 days. Get help right away if: You have a very bad headache. You cannot stop throwing up (vomiting). You have very bad pain or swelling around your face or eyes. You have trouble seeing. You feel confused. Your neck is stiff. You have trouble breathing. Summary Sinusitis is swelling of your sinuses. Sinuses are hollow spaces in the bones around your face. This condition is caused by tissues in your nose that become inflamed or swollen. This traps germs. These can lead to infection. If you were prescribed an antibiotic medicine, take it as told by your doctor. Do not stop taking it even if you start to feel better. Keep all follow-up visits as told by your doctor. This is important. This information is not intended to replace advice given to you by your health care provider. Make sure you discuss any questions you have with your healthcare provider. Document Revised: 11/26/2017 Document Reviewed: 11/26/2017 Elsevier Patient Education  2022 Else[employer] Inc. Quality Sleep Information, Adult Quality sleep is important for your mental and physical health. It also improves your quality of life. Quality sleep means you: Are asleep for most of the time you are in bed. Fall asleep within 30 minutes. Wake up no more than once a night.  Are awake for no longer than 20 minutes if you do wake up during the night. Most adults need 7-8 hours of quality sleep each night. How can poor sleep affect me? If you do not get enough quality sleep, you may have: Mood swings. Daytime sleepiness. Confusion. Decreased reaction time. Sleep disorders, such as insomnia and sleep apnea. Difficulty with: Solving problems. Coping with stress. Paying attention. These issues may affect your performance and productivity at work,  school, and at home. Lack of sleep may also put you at higher risk for accidents, suicide,and risky behaviors. If you do not get quality sleep you may also be at higher risk for several health problems, including: Infections. Type 2 diabetes. Heart disease. High blood pressure. Obesity. Worsening of long-term conditions, like arthritis, kidney disease, depression, Parkinson's disease, and epilepsy. What actions can I take to get more quality sleep?     Stick to a sleep schedule. Go to sleep and wake up at about the same time each day. Do not try to sleep less on weekdays and make up for lost sleep on weekends. This does not work. Try to get about 30 minutes of exercise on most days. Do not exercise 2-3 hours before going to bed. Limit naps during the day to 30 minutes or less. Do not use any products that contain nicotine or tobacco, such as cigarettes or e-cigarettes. If you need help quitting, ask your health care provider. Do not drink caffeinated beverages for at least 8 hours before going to bed. Coffee, tea, and some sodas contain caffeine. Do not drink alcohol close to bedtime. Do not eat large meals close to bedtime. Do not take naps in the late afternoon.  Try to get at least 30 minutes of sunlight every day. Morning sunlight is best. Make time to relax before bed. Reading, listening to music, or taking a hot bath promotes quality sleep. Make your bedroom a place that promotes quality sleep. Keep your bedroom dark, quiet, and at a comfortable room temperature. Make sure your bed is comfortable. Take out sleep distractions like TV, a computer, smartphone, and bright lights. If you are lying awake in bed for longer than 20 minutes, get up and do a relaxing activity until you feel sleepy. Work with your health care provider to treat medical conditions that may affect sleeping, such as: Nasal obstruction. Snoring. Sleep apnea and other sleep disorders. Talk to your health care  provider if you think any of your prescription medicines may cause you to have difficulty falling or staying asleep. If you have sleep problems, talk with a sleep consultant. If you think you have a sleep disorder, talk with your health care provider about getting evaluated by a specialist. Where to find more information National Sleep Foundation website: https://sleepfoundation.org National Heart, Lung, and Blood Institute (NHLBI): https://hall.info/.pdf Centers for Disease Control and Prevention (CDC): DetailSports.is Contact a health care provider if you: Have trouble getting to sleep or staying asleep. Often wake up very early in the morning and cannot get back to sleep. Have daytime sleepiness. Have daytime sleep attacks of suddenly falling asleep and sudden muscle weakness (narcolepsy). Have a tingling sensation in your legs with a strong urge to move your legs (restless legs syndrome). Stop breathing briefly during sleep (sleep apnea). Think you have a sleep disorder or are taking a medicine that is affecting your quality of sleep. Summary Most adults need 7-8 hours of quality sleep each night. Getting enough quality sleep is an important part of health and well-being. Make your bedroom a place that promotes quality sleep and avoid things that may cause you to have poor sleep, such as alcohol, caffeine, smoking, and large meals. Talk to your health care provider if you have trouble falling asleep or staying asleep. This information is not intended to replace advice given to you by your health care provider. Make sure you discuss any questions you have with your healthcare provider. Document Revised: 10/03/2017 Document Reviewed: 10/03/2017 Elsevier Patient Education  2022 Elsevier Inc. Insomnia Insomnia is a sleep disorder that makes it difficult to fall asleep or stay asleep. Insomnia can cause fatigue, low energy, difficulty  concentrating, moodswings, and poor performance at work or school. There are three different ways to classify insomnia: Difficulty falling asleep. Difficulty staying asleep. Waking up too early in the morning. Any type of insomnia can be long-term (chronic) or short-term (acute). Both are common. Short-term insomnia usually lasts for three months or less. Chronic insomnia occurs at least three times a week for longer than threemonths. What are the causes? Insomnia may be caused by another condition, situation, or substance, such as: Anxiety. Certain medicines. Gastroesophageal reflux disease (GERD) or other gastrointestinal conditions. Asthma or other breathing conditions. Restless legs syndrome, sleep apnea, or other sleep disorders. Chronic pain. Menopause. Stroke. Abuse of alcohol, tobacco, or illegal drugs. Mental health conditions, such as depression. Caffeine. Neurological disorders, such as Alzheimer's disease. An overactive thyroid (hyperthyroidism). Sometimes, the cause of insomnia may not be known. What increases the risk? Risk factors for insomnia include: Gender. Women are affected more often than men. Age. Insomnia is more common as you get older. Stress. Lack of exercise. Irregular work schedule or working night shifts.  Traveling between different time zones. Certain medical and mental health conditions. What are the signs or symptoms? If you have insomnia, the main symptom is having trouble falling asleep or having trouble staying asleep. This may lead to other symptoms, such as: Feeling fatigued or having low energy. Feeling nervous about going to sleep. Not feeling rested in the morning. Having trouble concentrating. Feeling irritable, anxious, or depressed. How is this diagnosed? This condition may be diagnosed based on: Your symptoms and medical history. Your health care provider may ask about: Your sleep habits. Any medical conditions you have. Your  mental health. A physical exam. How is this treated? Treatment for insomnia depends on the cause. Treatment may focus on treating an underlying condition that is causing insomnia. Treatment may also include: Medicines to help you sleep. Counseling or therapy. Lifestyle adjustments to help you sleep better. Follow these instructions at home: Eating and drinking  Limit or avoid alcohol, caffeinated beverages, and cigarettes, especially close to bedtime. These can disrupt your sleep. Do not eat a large meal or eat spicy foods right before bedtime. This can lead to digestive discomfort that can make it hard for you to sleep.  Sleep habits  Keep a sleep diary to help you and your health care provider figure out what could be causing your insomnia. Write down: When you sleep. When you wake up during the night. How well you sleep. How rested you feel the next day. Any side effects of medicines you are taking. What you eat and drink. Make your bedroom a dark, comfortable place where it is easy to fall asleep. Put up shades or blackout curtains to block light from outside. Use a white noise machine to block noise. Keep the temperature cool. Limit screen use before bedtime. This includes: Watching TV. Using your smartphone, tablet, or computer. Stick to a routine that includes going to bed and waking up at the same times every day and night. This can help you fall asleep faster. Consider making a quiet activity, such as reading, part of your nighttime routine. Try to avoid taking naps during the day so that you sleep better at night. Get out of bed if you are still awake after 15 minutes of trying to sleep. Keep the lights down, but try reading or doing a quiet activity. When you feel sleepy, go back to bed.  General instructions Take over-the-counter and prescription medicines only as told by your health care provider. Exercise regularly, as told by your health care provider. Avoid exercise  starting several hours before bedtime. Use relaxation techniques to manage stress. Ask your health care provider to suggest some techniques that may work well for you. These may include: Breathing exercises. Routines to release muscle tension. Visualizing peaceful scenes. Make sure that you drive carefully. Avoid driving if you feel very sleepy. Keep all follow-up visits as told by your health care provider. This is important. Contact a health care provider if: You are tired throughout the day. You have trouble in your daily routine due to sleepiness. You continue to have sleep problems, or your sleep problems get worse. Get help right away if: You have serious thoughts about hurting yourself or someone else. If you ever feel like you may hurt yourself or others, or have thoughts about taking your own life, get help right away. You can go to your nearest emergency department or call: Your local emergency services (911 in the [place] A suicide crisis helpline, such as the [employer] Suicide Prevention Lifeline  at 973 213 5577. This is open 24 hours a day. Summary Insomnia is a sleep disorder that makes it difficult to fall asleep or stay asleep. Insomnia can be long-term (chronic) or short-term (acute). Treatment for insomnia depends on the cause. Treatment may focus on treating an underlying condition that is causing insomnia. Keep a sleep diary to help you and your health care provider figure out what could be causing your insomnia. This information is not intended to replace advice given to you by your health care provider. Make sure you discuss any questions you have with your healthcare provider. Document Revised: 05/06/2020 Document Reviewed: 05/06/2020 Elsevier Patient Education  2022 Elsevier Inc. Otitis Media, Adult  Otitis media is a condition in which the middle ear is red and swollen (inflamed) and full of fluid. The middle ear is the part of the ear that contains bones for  hearing as well as air that helps send sounds to the brain. The conditionusually goes away on its own. What are the causes? This condition is caused by a blockage in the eustachian tube. The eustachian tube connects the middle ear to the back of the nose. It normally allows air into the middle ear. The blockage is caused by fluid or swelling. Problems that can cause blockage include: A cold or infection that affects the nose, mouth, or throat. Allergies. An irritant, such as tobacco smoke. Adenoids that have become large. The adenoids are soft tissue located in the back of the throat, behind the nose and the roof of the mouth. Growth or swelling in the upper part of the throat, just behind the nose (nasopharynx). Damage to the ear caused by change in pressure. This is called barotrauma. What are the signs or symptoms? Symptoms of this condition include: Ear pain. Fever. Problems with hearing. Being tired. Fluid leaking from the ear. Ringing in the ear. How is this treated? This condition can go away on its own within 3-5 days. But if the condition is caused by bacteria or does not go away on its own, or if it keeps coming back, your doctor may: Give you antibiotic medicines. Give you medicines for pain. Follow these instructions at home: Take over-the-counter and prescription medicines only as told by your doctor. If you were prescribed an antibiotic medicine, take it as told by your doctor. Do not stop taking the antibiotic even if you start to feel better. Keep all follow-up visits as told by your doctor. This is important. Contact a doctor if: You have bleeding from your nose. There is a lump on your neck. You are not feeling better in 5 days. You feel worse instead of better. Get help right away if: You have pain that is not helped with medicine. You have swelling, redness, or pain around your ear. You get a stiff neck. You cannot move part of your face (paralysis). You notice  that the bone behind your ear hurts when you touch it. You get a very bad headache. Summary Otitis media means that the middle ear is red, swollen, and full of fluid. This condition usually goes away on its own. If the problem does not go away, treatment may be needed. You may be given medicines to treat the infection or to treat your pain. If you were prescribed an antibiotic medicine, take it as told by your doctor. Do not stop taking the antibiotic even if you start to feel better. Keep all follow-up visits as told by your doctor. This is important. This information is  not intended to replace advice given to you by your health care provider. Make sure you discuss any questions you have with your healthcare provider. Document Revised: 05/29/2019 Document Reviewed: 05/29/2019 Elsevier Patient Education  2022 [employer].

## 2021-01-20 ENCOUNTER — Ambulatory Visit: Payer: BC Managed Care – PPO | Admitting: Family Medicine

## 2021-01-23 ENCOUNTER — Encounter: Payer: Self-pay | Admitting: Family Medicine

## 2021-02-15 NOTE — Progress Notes (Signed)
Subjective:  Patient ID: James Craig, male    DOB: Jun 30, 1956  Age: 65 y.o. MRN: [id number]  Chief Complaint  Patient presents with   Hypothyroidism   Hyperlipidemia    HPI Cholesterol-taking crestor 5 mg daily. Not eating as healthy as she should. Not exercising.  Insomnia-takes trazodone 50 mg at night prn Testosterone- 1.5 mls (300mg ) total every 14 days has concerns that his testosterone is still low. Reports having issues with erections some times. Still has low energy. Sexual desire is good.   Current Outpatient Medications on File Prior to Visit  Medication Sig Dispense Refill   Colchicine (MITIGARE) 0.6 MG CAPS Take 1 capsule by mouth in the morning and at bedtime for 7 days. 30 capsule 1   fluticasone (FLONASE) 50 MCG/ACT nasal spray Place 2 sprays into both nostrils daily. 16 g 6   rosuvastatin (CRESTOR) 5 MG tablet TAKE ONE TABLET BY MOUTH EVERY DAY 90 tablet 0   testosterone cypionate (DEPOTESTOSTERONE CYPIONATE) 200 MG/ML injection Inject 1.5 mLs (300 mg total) into the muscle every 14 (fourteen) days. 10 mL 1   traZODone (DESYREL) 50 MG tablet Take 0.5-1 tablets (25-50 mg total) by mouth at bedtime as needed for sleep. 30 tablet 3   No current facility-administered medications on file prior to visit.   Past Medical History:  Diagnosis Date   Hyperlipidemia    No past surgical history on file.  Family History  Family history unknown: Yes   Social History   Socioeconomic History   Marital status: Married    Spouse name: Not on file   Number of children: Not on file   Years of education: Not on file   Highest education level: Not on file  Occupational History    Employer: [employer]  Tobacco Use   Smoking status: Never   Smokeless tobacco: Never  Substance and Sexual Activity   Alcohol use: Never   Drug use: Never   Sexual activity: Not on file  Other Topics Concern   Not on file  Social History Narrative   Not on file   Social Determinants  of Health   Financial Resource Strain: Not on file  Food Insecurity: Not on file  Transportation Needs: Not on file  Physical Activity: Not on file  Stress: Not on file  Social Connections: Not on file    Review of Systems  Constitutional:  Positive for diaphoresis and fatigue. Negative for chills and fever.  HENT:  Positive for congestion. Negative for ear pain and sore throat.   Respiratory:  Negative for cough and shortness of breath.   Cardiovascular:  Negative for chest pain and leg swelling.  Gastrointestinal:  Negative for abdominal pain, constipation, diarrhea, nausea and vomiting.  Genitourinary:  Negative for dysuria and urgency.  Musculoskeletal:  Negative for arthralgias and myalgias.  Neurological:  Negative for dizziness and headaches.  Psychiatric/Behavioral:  Negative for dysphoric mood.     Objective:  BP 128/64   Pulse 62   Temp (!) 97.2 F (36.2 C)   Ht 6\' 2"  (1.88 m)   Wt 250 lb (113.4 kg)   SpO2 100%   BMI 32.10 kg/m   BP/Weight 02/16/2021 01/19/2021 01/06/2021  Systolic BP 128 132 130  Diastolic BP 64 78 76  Wt. (Lbs) 250 250 255  BMI 32.1 32.1 32.74    Physical Exam Vitals reviewed.  Constitutional:      Appearance: Normal appearance. He is normal weight.  HENT:     Nose:  Right Turbinates: Swollen.     Left Turbinates: Swollen.  Cardiovascular:     Rate and Rhythm: Normal rate and regular rhythm.     Heart sounds: No murmur heard. Pulmonary:     Effort: Pulmonary effort is normal.     Breath sounds: Normal breath sounds.  Abdominal:     General: Abdomen is flat. Bowel sounds are normal.     Palpations: Abdomen is soft.     Tenderness: There is no abdominal tenderness.  Neurological:     Mental Status: He is alert and oriented to person, place, and time.  Psychiatric:        Mood and Affect: Mood normal.        Behavior: Behavior normal.    Diabetic Foot Exam - Simple   No data filed      Lab Results  Component Value Date    WBC 5.9 09/01/2020   HGB 17.9 (H) 09/01/2020   HCT 52.7 (H) 09/01/2020   PLT 216 09/01/2020   GLUCOSE 107 (H) 04/15/2020   CHOL 223 (H) 04/15/2020   TRIG 139 04/15/2020   HDL 47 04/15/2020   LDLCALC 151 (H) 04/15/2020   ALT 32 04/15/2020   AST 21 04/15/2020   NA 137 04/15/2020   K 4.7 04/15/2020   CL 100 04/15/2020   CREATININE 1.03 04/15/2020   BUN 11 04/15/2020   CO2 24 04/15/2020   TSH 2.830 04/15/2020      Assessment & Plan:   1. Mixed hyperlipidemia The current medical regimen is effective;  continue present plan and medications. Recommend continue to work on eating healthy diet and exercise. - CBC with Differential/Platelet - Comprehensive metabolic panel - Lipid panel - TSH   2. Testicular hypofunction - Testosterone,Free and Total -  SA.  Consider viagra.  Consider referral to endocrinology or urology.   3. Other insomnia Discussed possibly sending for a sleep study  No orders of the defined types were placed in this encounter.   Orders Placed This Encounter  Procedures   CBC with Differential/Platelet   Comprehensive metabolic panel   Lipid panel   Testosterone,Free and Total   TSH   PSA      Follow-up: Return in about 3 months (around 05/19/2021) for fasting.  An After Visit Summary was printed and given to the patient.  Blane Ohara, MD [facility] [phone]

## 2021-02-16 ENCOUNTER — Ambulatory Visit: Payer: BC Managed Care – PPO | Admitting: Family Medicine

## 2021-02-16 ENCOUNTER — Other Ambulatory Visit: Payer: Self-pay

## 2021-02-16 ENCOUNTER — Encounter: Payer: Self-pay | Admitting: Family Medicine

## 2021-02-16 ENCOUNTER — Ambulatory Visit (INDEPENDENT_AMBULATORY_CARE_PROVIDER_SITE_OTHER): Payer: BC Managed Care – PPO | Admitting: Family Medicine

## 2021-02-16 VITALS — BP 128/64 | HR 62 | Temp 97.2°F | Ht 74.0 in | Wt 250.0 lb

## 2021-02-16 DIAGNOSIS — E782 Mixed hyperlipidemia: Secondary | ICD-10-CM

## 2021-02-16 DIAGNOSIS — E291 Testicular hypofunction: Secondary | ICD-10-CM | POA: Diagnosis not present

## 2021-02-16 DIAGNOSIS — G4709 Other insomnia: Secondary | ICD-10-CM

## 2021-02-17 LAB — PSA: Prostate Specific Ag, Serum: 0.5 ng/mL (ref 0.0–4.0)

## 2021-02-20 LAB — COMPREHENSIVE METABOLIC PANEL
ALT: 28 IU/L (ref 0–44)
AST: 21 IU/L (ref 0–40)
Albumin/Globulin Ratio: 2.5 — ABNORMAL HIGH (ref 1.2–2.2)
Albumin: 4.7 g/dL (ref 3.8–4.8)
Alkaline Phosphatase: 54 IU/L (ref 44–121)
BUN/Creatinine Ratio: 12 (ref 10–24)
BUN: 13 mg/dL (ref 8–27)
Bilirubin Total: 0.7 mg/dL (ref 0.0–1.2)
CO2: 23 mmol/L (ref 20–29)
Calcium: 9.3 mg/dL (ref 8.6–10.2)
Chloride: 99 mmol/L (ref 96–106)
Creatinine, Ser: 1.07 mg/dL (ref 0.76–1.27)
Globulin, Total: 1.9 g/dL (ref 1.5–4.5)
Glucose: 107 mg/dL — ABNORMAL HIGH (ref 65–99)
Potassium: 4.8 mmol/L (ref 3.5–5.2)
Sodium: 135 mmol/L (ref 134–144)
Total Protein: 6.6 g/dL (ref 6.0–8.5)
eGFR: 78 mL/min/{1.73_m2} (ref >59)

## 2021-02-20 LAB — CBC WITH DIFFERENTIAL/PLATELET
Basophils Absolute: 0.1 10*3/uL (ref 0.0–0.2)
Basos: 1 %
EOS (ABSOLUTE): 0.1 10*3/uL (ref 0.0–0.4)
Eos: 1 %
Hematocrit: 53.6 % — ABNORMAL HIGH (ref 37.5–51.0)
Hemoglobin: 18 g/dL — ABNORMAL HIGH (ref 13.0–17.7)
Immature Grans (Abs): 0 10*3/uL (ref 0.0–0.1)
Immature Granulocytes: 0 %
Lymphocytes Absolute: 1.6 10*3/uL (ref 0.7–3.1)
Lymphs: 30 %
MCH: 32.1 pg (ref 26.6–33.0)
MCHC: 33.6 g/dL (ref 31.5–35.7)
MCV: 96 fL (ref 79–97)
Monocytes Absolute: 0.5 10*3/uL (ref 0.1–0.9)
Monocytes: 10 %
Neutrophils Absolute: 3.1 10*3/uL (ref 1.4–7.0)
Neutrophils: 58 %
Platelets: 219 10*3/uL (ref 150–450)
RBC: 5.61 x10E6/uL (ref 4.14–5.80)
RDW: 11.9 % (ref 11.6–15.4)
WBC: 5.4 10*3/uL (ref 3.4–10.8)

## 2021-02-20 LAB — LIPID PANEL
Chol/HDL Ratio: 3.9 ratio (ref 0.0–5.0)
Cholesterol, Total: 158 mg/dL (ref 100–199)
HDL: 41 mg/dL (ref >39)
LDL Chol Calc (NIH): 98 mg/dL (ref 0–99)
Triglycerides: 100 mg/dL (ref 0–149)
VLDL Cholesterol Cal: 19 mg/dL (ref 5–40)

## 2021-02-20 LAB — CARDIOVASCULAR RISK ASSESSMENT

## 2021-02-20 LAB — TESTOSTERONE,FREE AND TOTAL
Testosterone, Free: 18.4 pg/mL — ABNORMAL HIGH (ref 6.6–18.1)
Testosterone: 523 ng/dL (ref 264–916)

## 2021-02-20 LAB — TSH: TSH: 3.71 u[IU]/mL (ref 0.450–4.500)

## 2021-03-03 ENCOUNTER — Other Ambulatory Visit: Payer: Self-pay

## 2021-03-03 DIAGNOSIS — E291 Testicular hypofunction: Secondary | ICD-10-CM

## 2021-03-03 MED ORDER — SILDENAFIL CITRATE 100 MG PO TABS: 50.0000 mg | ORAL_TABLET | Freq: Every day | ORAL | 11 refills | Status: DC | PRN

## 2021-03-03 MED ORDER — TESTOSTERONE CYPIONATE 200 MG/ML IM SOLN: 300.0000 mg | INTRAMUSCULAR | 1 refills | Status: DC

## 2021-03-31 DIAGNOSIS — D485 Neoplasm of uncertain behavior of skin: Secondary | ICD-10-CM | POA: Diagnosis not present

## 2021-03-31 DIAGNOSIS — L720 Epidermal cyst: Secondary | ICD-10-CM | POA: Diagnosis not present

## 2021-04-07 DIAGNOSIS — L570 Actinic keratosis: Secondary | ICD-10-CM | POA: Diagnosis not present

## 2021-05-25 ENCOUNTER — Other Ambulatory Visit: Payer: Self-pay

## 2021-05-25 ENCOUNTER — Ambulatory Visit (INDEPENDENT_AMBULATORY_CARE_PROVIDER_SITE_OTHER): Payer: BC Managed Care – PPO | Admitting: Family Medicine

## 2021-05-25 ENCOUNTER — Encounter: Payer: Self-pay | Admitting: Family Medicine

## 2021-05-25 VITALS — BP 118/78 | HR 80 | Temp 97.4°F | Resp 16 | Ht 74.0 in | Wt 251.0 lb

## 2021-05-25 DIAGNOSIS — D751 Secondary polycythemia: Secondary | ICD-10-CM

## 2021-05-25 DIAGNOSIS — Z6832 Body mass index (BMI) 32.0-32.9, adult: Secondary | ICD-10-CM | POA: Insufficient documentation

## 2021-05-25 DIAGNOSIS — Z23 Encounter for immunization: Secondary | ICD-10-CM

## 2021-05-25 DIAGNOSIS — E291 Testicular hypofunction: Secondary | ICD-10-CM

## 2021-05-25 DIAGNOSIS — E782 Mixed hyperlipidemia: Secondary | ICD-10-CM

## 2021-05-25 DIAGNOSIS — R7301 Impaired fasting glucose: Secondary | ICD-10-CM | POA: Diagnosis not present

## 2021-05-25 MED ORDER — TESTOSTERONE CYPIONATE 200 MG/ML IM SOLN: 300.0000 mg | INTRAMUSCULAR | 1 refills | Status: DC

## 2021-05-25 MED ORDER — TADALAFIL 5 MG PO TABS: 5.0000 mg | ORAL_TABLET | Freq: Every day | ORAL | 3 refills | Status: DC

## 2021-05-25 NOTE — Progress Notes (Signed)
Subjective:  Patient ID: James Craig, male    DOB: [birth year]  Age: 65 y.o. MRN: [id number]  Chief Complaint  Patient presents with   Hyperlipidemia   Hypothyroidism    HPI Hyperlipidemia: Patient has been working very hard on his diet and exercising.  He was given Crestor 5 mg daily in July.  The patient has not continued to take this.    Hypogonadism: Patient is frustrated.  I have referred him to endocrinology for his testosterone as it does not seem to be helping his energy level.  He is scheduled in April 2023.  Prediabetes: Patient's previous A1c was 5.71 to 2 years ago.   Current Outpatient Medications on File Prior to Visit  Medication Sig Dispense Refill   fluticasone (FLONASE) 50 MCG/ACT nasal spray Place 2 sprays into both nostrils daily. 16 g 6   rosuvastatin (CRESTOR) 5 MG tablet TAKE ONE TABLET BY MOUTH EVERY DAY 90 tablet 0   traZODone (DESYREL) 50 MG tablet Take 0.5-1 tablets (25-50 mg total) by mouth at bedtime as needed for sleep. 30 tablet 3   No current facility-administered medications on file prior to visit.   Past Medical History:  Diagnosis Date   Hyperlipidemia    History reviewed. No pertinent surgical history.  Family History  Family history unknown: Yes   Social History   Socioeconomic History   Marital status: Married    Spouse name: Not on file   Number of children: Not on file   Years of education: Not on file   Highest education level: Not on file  Occupational History    Employer: [employer]  Tobacco Use   Smoking status: Never   Smokeless tobacco: Never  Substance and Sexual Activity   Alcohol use: Never   Drug use: Never   Sexual activity: Not on file  Other Topics Concern   Not on file  Social History Narrative   Not on file   Social Determinants of Health   Financial Resource Strain: Not on file  Food Insecurity: Not on file  Transportation Needs: Not on file  Physical Activity: Not on file  Stress: Not on  file  Social Connections: Not on file    Review of Systems  Constitutional:  Negative for chills and fever.  HENT:  Positive for congestion. Negative for rhinorrhea and sore throat.   Respiratory:  Negative for cough and shortness of breath.   Cardiovascular:  Negative for chest pain and palpitations.  Gastrointestinal:  Negative for abdominal pain, constipation, diarrhea, nausea and vomiting.  Genitourinary:  Negative for dysuria and urgency.  Musculoskeletal:  Negative for arthralgias, back pain and myalgias.  Neurological:  Negative for dizziness and headaches.  Psychiatric/Behavioral:  Negative for dysphoric mood. The patient is not nervous/anxious.     Objective:  BP 118/78   Pulse 80   Temp (!) 97.4 F (36.3 C)   Resp 16   Ht 6\' 2"  (1.88 m)   Wt 251 lb (113.9 kg)   BMI 32.23 kg/m   BP/Weight 05/25/2021 02/16/2021 01/19/2021  Systolic BP 118 128 132  Diastolic BP 78 64 78  Wt. (Lbs) 251 250 250  BMI 32.23 32.1 32.1    Physical Exam Vitals reviewed.  Constitutional:      Appearance: Normal appearance. He is obese.  Neck:     Vascular: No carotid bruit.  Cardiovascular:     Rate and Rhythm: Normal rate and regular rhythm.     Heart sounds: Normal heart sounds.  Pulmonary:     Effort: Pulmonary effort is normal.     Breath sounds: Normal breath sounds. No wheezing, rhonchi or rales.  Abdominal:     General: Bowel sounds are normal.     Palpations: Abdomen is soft.     Tenderness: There is no abdominal tenderness.  Neurological:     Mental Status: He is alert.  Psychiatric:        Mood and Affect: Mood normal.        Behavior: Behavior normal.    Diabetic Foot Exam - Simple   No data filed      Lab Results  Component Value Date   WBC 6.5 05/25/2021   HGB 18.2 (H) 05/25/2021   HCT 53.5 (H) 05/25/2021   PLT 263 05/25/2021   GLUCOSE 97 05/25/2021   CHOL 177 05/25/2021   TRIG 81 05/25/2021   HDL 39 (L) 05/25/2021   LDLCALC 123 (H) 05/25/2021   ALT  28 05/25/2021   AST 20 05/25/2021   NA 136 05/25/2021   K 4.9 05/25/2021   CL 99 05/25/2021   CREATININE 1.27 05/25/2021   BUN 10 05/25/2021   CO2 25 05/25/2021   TSH 3.710 02/16/2021   HGBA1C 5.9 (H) 05/25/2021      Assessment & Plan:   Problem List Items Addressed This Visit       Endocrine   Testicular hypofunction - Primary (Chronic)    Await endocrinology referral.  Currently treating with testosterone shots.       Relevant Medications   testosterone cypionate (DEPOTESTOSTERONE CYPIONATE) 200 MG/ML injection   Other Relevant Orders   Testosterone,Free and Total (Completed)   Impaired fasting glucose    Recommend continue to work on eating healthy diet and exercise.       Relevant Orders   Hemoglobin A1c (Completed)     Other   Mixed hyperlipidemia (Chronic)    Worsened slightly since last visit as patient did not continue Crestor.  His lipids, however, are significantly better than labs 1 year ago.  Continue to work on eating a healthy diet and exercise.  Labs drawn today.        Relevant Medications   tadalafil (CIALIS) 5 MG tablet   Other Relevant Orders   CBC with Differential/Platelet (Completed)   Comprehensive metabolic panel (Completed)   Lipid panel (Completed)   VITAMIN D 25 Hydroxy (Vit-D Deficiency, Fractures) (Completed)   BMI 32.0-32.9,adult    Recommend continue to work on eating healthy diet and exercise.       Polycythemia    Likely secondary to testosterone replacement.      Other Visit Diagnoses     Encounter for immunization       Relevant Orders   Flu Vaccine MDCK QUAD PF (Completed)     .  Meds ordered this encounter  Medications   tadalafil (CIALIS) 5 MG tablet    Sig: Take 1 tablet (5 mg total) by mouth daily.    Dispense:  90 tablet    Refill:  3   testosterone cypionate (DEPOTESTOSTERONE CYPIONATE) 200 MG/ML injection    Sig: Inject 1.5 mLs (300 mg total) into the muscle every 14 (fourteen) days.    Dispense:   10 mL    Refill:  1     Orders Placed This Encounter  Procedures   Flu Vaccine MDCK QUAD PF   CBC with Differential/Platelet   Comprehensive metabolic panel   Lipid panel   Testosterone,Free and Total   Hemoglobin  A1c   VITAMIN D 25 Hydroxy (Vit-D Deficiency, Fractures)   Cardiovascular Risk Assessment     Follow-up: No follow-ups on file.  An After Visit Summary was printed and given to the patient.  Blane Ohara, MD [facility] [phone]

## 2021-05-26 LAB — COMPREHENSIVE METABOLIC PANEL
ALT: 28 IU/L (ref 0–44)
AST: 20 IU/L (ref 0–40)
Albumin/Globulin Ratio: 2.3 — ABNORMAL HIGH (ref 1.2–2.2)
Albumin: 4.8 g/dL (ref 3.8–4.8)
Alkaline Phosphatase: 62 IU/L (ref 44–121)
BUN/Creatinine Ratio: 8 — ABNORMAL LOW (ref 10–24)
BUN: 10 mg/dL (ref 8–27)
Bilirubin Total: 0.7 mg/dL (ref 0.0–1.2)
CO2: 25 mmol/L (ref 20–29)
Calcium: 9.6 mg/dL (ref 8.6–10.2)
Chloride: 99 mmol/L (ref 96–106)
Creatinine, Ser: 1.27 mg/dL (ref 0.76–1.27)
Globulin, Total: 2.1 g/dL (ref 1.5–4.5)
Glucose: 97 mg/dL (ref 70–99)
Potassium: 4.9 mmol/L (ref 3.5–5.2)
Sodium: 136 mmol/L (ref 134–144)
Total Protein: 6.9 g/dL (ref 6.0–8.5)
eGFR: 64 mL/min/{1.73_m2} (ref >59)

## 2021-05-26 LAB — HEMOGLOBIN A1C
Est. average glucose Bld gHb Est-mCnc: 123 mg/dL
Hgb A1c MFr Bld: 5.9 % — ABNORMAL HIGH (ref 4.8–5.6)

## 2021-05-26 LAB — CBC WITH DIFFERENTIAL/PLATELET
Basophils Absolute: 0.1 10*3/uL (ref 0.0–0.2)
Basos: 1 %
EOS (ABSOLUTE): 0 10*3/uL (ref 0.0–0.4)
Eos: 1 %
Hematocrit: 53.5 % — ABNORMAL HIGH (ref 37.5–51.0)
Hemoglobin: 18.2 g/dL — ABNORMAL HIGH (ref 13.0–17.7)
Immature Grans (Abs): 0 10*3/uL (ref 0.0–0.1)
Immature Granulocytes: 0 %
Lymphocytes Absolute: 1.9 10*3/uL (ref 0.7–3.1)
Lymphs: 30 %
MCH: 30.8 pg (ref 26.6–33.0)
MCHC: 34 g/dL (ref 31.5–35.7)
MCV: 91 fL (ref 79–97)
Monocytes Absolute: 0.6 10*3/uL (ref 0.1–0.9)
Monocytes: 9 %
Neutrophils Absolute: 3.9 10*3/uL (ref 1.4–7.0)
Neutrophils: 59 %
Platelets: 263 10*3/uL (ref 150–450)
RBC: 5.91 x10E6/uL — ABNORMAL HIGH (ref 4.14–5.80)
RDW: 12.6 % (ref 11.6–15.4)
WBC: 6.5 10*3/uL (ref 3.4–10.8)

## 2021-05-26 LAB — LIPID PANEL
Chol/HDL Ratio: 4.5 ratio (ref 0.0–5.0)
Cholesterol, Total: 177 mg/dL (ref 100–199)
HDL: 39 mg/dL — ABNORMAL LOW (ref >39)
LDL Chol Calc (NIH): 123 mg/dL — ABNORMAL HIGH (ref 0–99)
Triglycerides: 81 mg/dL (ref 0–149)
VLDL Cholesterol Cal: 15 mg/dL (ref 5–40)

## 2021-05-26 LAB — TESTOSTERONE,FREE AND TOTAL
Testosterone, Free: 16.4 pg/mL (ref 6.6–18.1)
Testosterone: 503 ng/dL (ref 264–916)

## 2021-05-26 LAB — VITAMIN D 25 HYDROXY (VIT D DEFICIENCY, FRACTURES): Vit D, 25-Hydroxy: 55 ng/mL (ref 30.0–100.0)

## 2021-06-07 ENCOUNTER — Ambulatory Visit (INDEPENDENT_AMBULATORY_CARE_PROVIDER_SITE_OTHER): Payer: BC Managed Care – PPO | Admitting: Family Medicine

## 2021-06-07 DIAGNOSIS — E291 Testicular hypofunction: Secondary | ICD-10-CM | POA: Diagnosis not present

## 2021-06-07 DIAGNOSIS — J018 Other acute sinusitis: Secondary | ICD-10-CM

## 2021-06-07 DIAGNOSIS — L03012 Cellulitis of left finger: Secondary | ICD-10-CM | POA: Diagnosis not present

## 2021-06-07 MED ORDER — BENZONATATE 200 MG PO CAPS: 200.0000 mg | ORAL_CAPSULE | Freq: Two times a day (BID) | ORAL | 0 refills | Status: DC | PRN

## 2021-06-07 MED ORDER — DOXYCYCLINE HYCLATE 100 MG PO TABS: 100.0000 mg | ORAL_TABLET | Freq: Two times a day (BID) | ORAL | 0 refills | Status: DC

## 2021-06-07 NOTE — Progress Notes (Signed)
Acute Office Visit  Subjective:    Patient ID: James Craig, male    DOB: [birth year], 65 y.o.   MRN: [id number]  Chief Complaint  Patient presents with   Cough   Diarrhea    HPI: Patient is in today for fevers and chills on Saturday.  Complaining of nasal congestion sore throat cough and shortness of breath which began yesterday.  Patient has a little headache at times.  Patient is also noted to have redness and pain around his left thumbnail.  Pt is concerned that cialis may have been causing some diarrhea (1-3%.) Started last week and had diarrhea for 2 days then stopped it.   Past Medical History:  Diagnosis Date   Hyperlipidemia     History reviewed. No pertinent surgical history.  Family History  Family history unknown: Yes    Social History   Socioeconomic History   Marital status: Married    Spouse name: Not on file   Number of children: Not on file   Years of education: Not on file   Highest education level: Not on file  Occupational History    Employer: [employer]  Tobacco Use   Smoking status: Never   Smokeless tobacco: Never  Substance and Sexual Activity   Alcohol use: Never   Drug use: Never   Sexual activity: Not on file  Other Topics Concern   Not on file  Social History Narrative   Not on file   Social Determinants of Health   Financial Resource Strain: Not on file  Food Insecurity: Not on file  Transportation Needs: Not on file  Physical Activity: Not on file  Stress: Not on file  Social Connections: Not on file  Intimate Partner Violence: Not on file    Outpatient Medications Prior to Visit  Medication Sig Dispense Refill   fluticasone (FLONASE) 50 MCG/ACT nasal spray Place 2 sprays into both nostrils daily. 16 g 6   rosuvastatin (CRESTOR) 5 MG tablet TAKE ONE TABLET BY MOUTH EVERY DAY 90 tablet 0   tadalafil (CIALIS) 5 MG tablet Take 1 tablet (5 mg total) by mouth daily. 90 tablet 3   testosterone cypionate (DEPOTESTOSTERONE  CYPIONATE) 200 MG/ML injection Inject 1.5 mLs (300 mg total) into the muscle every 14 (fourteen) days. 10 mL 1   traZODone (DESYREL) 50 MG tablet Take 0.5-1 tablets (25-50 mg total) by mouth at bedtime as needed for sleep. 30 tablet 3   No facility-administered medications prior to visit.    Allergies  Allergen Reactions   Penicillins Hives    Review of Systems  Constitutional:  Positive for fever.  HENT:  Positive for congestion and sore throat.   Respiratory:  Positive for cough and shortness of breath.   Gastrointestinal:  Positive for diarrhea.      Objective:    Physical Exam Constitutional:      Appearance: Normal appearance.  HENT:     Right Ear: Tympanic membrane, ear canal and external ear normal.     Left Ear: Tympanic membrane, ear canal and external ear normal.     Nose: Congestion present. No rhinorrhea.     Comments: Sinus tenderness.    Mouth/Throat:     Mouth: Mucous membranes are moist.     Pharynx: No oropharyngeal exudate or posterior oropharyngeal erythema.  Cardiovascular:     Rate and Rhythm: Normal rate and regular rhythm.     Heart sounds: Normal heart sounds.  Pulmonary:     Effort: Pulmonary effort  is normal. No respiratory distress.     Breath sounds: Normal breath sounds. No wheezing, rhonchi or rales.  Lymphadenopathy:     Cervical: No cervical adenopathy.  Neurological:     Mental Status: He is alert.  Psychiatric:        Mood and Affect: Mood normal.        Behavior: Behavior normal.    There were no vitals taken for this visit. Wt Readings from Last 3 Encounters:  05/25/21 251 lb (113.9 kg)  02/16/21 250 lb (113.4 kg)  01/19/21 250 lb (113.4 kg)    Health Maintenance Due  Topic Date Due   HIV Screening  Never done   Hepatitis C Screening  Never done   COVID-19 Vaccine (3 - Booster for Pfizer series) 12/02/2019   COLONOSCOPY (Pts 45-39yrs Insurance coverage will need to be confirmed)  05/12/2021    There are no preventive  care reminders to display for this patient.   Lab Results  Component Value Date   TSH 3.710 02/16/2021   Lab Results  Component Value Date   WBC 6.5 05/25/2021   HGB 18.2 (H) 05/25/2021   HCT 53.5 (H) 05/25/2021   MCV 91 05/25/2021   PLT 263 05/25/2021   Lab Results  Component Value Date   NA 136 05/25/2021   K 4.9 05/25/2021   CO2 25 05/25/2021   GLUCOSE 97 05/25/2021   BUN 10 05/25/2021   CREATININE 1.27 05/25/2021   BILITOT 0.7 05/25/2021   ALKPHOS 62 05/25/2021   AST 20 05/25/2021   ALT 28 05/25/2021   PROT 6.9 05/25/2021   ALBUMIN 4.8 05/25/2021   CALCIUM 9.6 05/25/2021   EGFR 64 05/25/2021   Lab Results  Component Value Date   CHOL 177 05/25/2021   Lab Results  Component Value Date   HDL 39 (L) 05/25/2021   Lab Results  Component Value Date   LDLCALC 123 (H) 05/25/2021   Lab Results  Component Value Date   TRIG 81 05/25/2021   Lab Results  Component Value Date   CHOLHDL 4.5 05/25/2021   Lab Results  Component Value Date   HGBA1C 5.9 (H) 05/25/2021       Assessment & Plan:   Problem List Items Addressed This Visit       Respiratory   Sinusitis - Primary    Doxycycline prescribed. Tessalon Perles prescribed      Relevant Medications   doxycycline (VIBRA-TABS) 100 MG tablet   benzonatate (TESSALON) 200 MG capsule   Other Relevant Orders   POC COVID-19 BinaxNow (Completed)   POCT Influenza A/B (Completed)     Endocrine   Testicular hypofunction (Chronic)    Hold Cialis due to possible side effects. Recommend patient call [facility] clinic in [facility] or least to research it.        Musculoskeletal and Integument   Paronychia of left thumb    Spoke in [employer] salt warm water. Doxycycline prescribed.      Meds ordered this encounter  Medications   doxycycline (VIBRA-TABS) 100 MG tablet    Sig: Take 1 tablet (100 mg total) by mouth 2 (two) times daily.    Dispense:  20 tablet    Refill:  0   benzonatate (TESSALON) 200 MG  capsule    Sig: Take 1 capsule (200 mg total) by mouth 2 (two) times daily as needed for cough.    Dispense:  30 capsule    Refill:  0    Orders Placed This  Encounter  Procedures   POC COVID-19 BinaxNow   POCT Influenza A/B      Follow-up: No follow-ups on file.  An After Visit Summary was printed and given to the patient.  Blane Ohara, MD [facility] [phone]

## 2021-06-07 NOTE — Patient Instructions (Signed)
Rx: Doxycycline for sinus infection and cuticle infection. Tessalon perles for cough.  Flonase for nasal congestion.

## 2021-06-08 ENCOUNTER — Encounter: Payer: Self-pay | Admitting: Family Medicine

## 2021-06-08 DIAGNOSIS — D751 Secondary polycythemia: Secondary | ICD-10-CM | POA: Insufficient documentation

## 2021-06-08 DIAGNOSIS — L03012 Cellulitis of left finger: Secondary | ICD-10-CM | POA: Insufficient documentation

## 2021-06-08 LAB — POC COVID19 BINAXNOW: SARS Coronavirus 2 Ag: NEGATIVE

## 2021-06-08 LAB — POCT INFLUENZA A/B
Influenza A, POC: NEGATIVE
Influenza B, POC: NEGATIVE

## 2021-06-08 NOTE — Assessment & Plan Note (Signed)
Worsened slightly since last visit as patient did not continue Crestor.  His lipids, however, are significantly better than labs 1 year ago.  Continue to work on eating a healthy diet and exercise.  Labs drawn today.

## 2021-06-08 NOTE — Assessment & Plan Note (Addendum)
Await endocrinology referral.  Currently treating with testosterone shots.  Start cialis 5 mg daily

## 2021-06-08 NOTE — Assessment & Plan Note (Signed)
Hold Cialis due to possible side effects. Recommend patient call [facility] clinic in [facility] or least to research it.

## 2021-06-08 NOTE — Assessment & Plan Note (Signed)
Doxycycline prescribed. Tessalon Perles prescribed

## 2021-06-08 NOTE — Assessment & Plan Note (Signed)
Recommend continue to work on eating healthy diet and exercise.  

## 2021-06-08 NOTE — Assessment & Plan Note (Signed)
Likely secondary to testosterone replacement.

## 2021-06-08 NOTE — Assessment & Plan Note (Signed)
Spoke in [employer] salt warm water. Doxycycline prescribed.

## 2021-06-27 DIAGNOSIS — Z800 Family history of malignant neoplasm of digestive organs: Secondary | ICD-10-CM | POA: Diagnosis not present

## 2021-06-27 DIAGNOSIS — Z1211 Encounter for screening for malignant neoplasm of colon: Secondary | ICD-10-CM | POA: Diagnosis not present

## 2021-06-27 LAB — HM COLONOSCOPY

## 2021-07-18 ENCOUNTER — Encounter: Payer: Self-pay | Admitting: Family Medicine

## 2021-08-29 ENCOUNTER — Other Ambulatory Visit: Payer: Self-pay

## 2021-08-29 ENCOUNTER — Ambulatory Visit (INDEPENDENT_AMBULATORY_CARE_PROVIDER_SITE_OTHER): Payer: BC Managed Care – PPO | Admitting: Family Medicine

## 2021-08-29 VITALS — BP 120/60 | HR 68 | Temp 97.4°F | Resp 18 | Ht 73.0 in | Wt 256.0 lb

## 2021-08-29 DIAGNOSIS — E291 Testicular hypofunction: Secondary | ICD-10-CM

## 2021-08-29 DIAGNOSIS — E6609 Other obesity due to excess calories: Secondary | ICD-10-CM

## 2021-08-29 DIAGNOSIS — E782 Mixed hyperlipidemia: Secondary | ICD-10-CM | POA: Diagnosis not present

## 2021-08-29 DIAGNOSIS — R7301 Impaired fasting glucose: Secondary | ICD-10-CM

## 2021-08-29 DIAGNOSIS — Z6832 Body mass index (BMI) 32.0-32.9, adult: Secondary | ICD-10-CM | POA: Insufficient documentation

## 2021-08-29 DIAGNOSIS — D751 Secondary polycythemia: Secondary | ICD-10-CM

## 2021-08-29 DIAGNOSIS — Z6833 Body mass index (BMI) 33.0-33.9, adult: Secondary | ICD-10-CM

## 2021-08-29 NOTE — Progress Notes (Signed)
Subjective:  Patient ID: James Craig, male    DOB: [birth year]  Age: 66 y.o. MRN: [id number]  Chief Complaint  Patient presents with   Hyperlipidemia   Testicular hypofunction    HPI Hyperlipidemia:  Not tolerating Crestor 5 mg daily. Does not exercise. Eating healthy - avoiding fried foods   Working 40-50 hrs per week   Testicular hypofunction:  Cialis 5 mg daily, Testosterone 200 mg IM injection every 14 days. If extra exertion, becomes tired. Erectile dysfunction is variable. Viagra doesn't help. Cialis daily gives him a headache .He is scheduled to see endocrinology.  Prediabetes: eats some carbs/potatoes/bread.   Current Outpatient Medications on File Prior to Visit  Medication Sig Dispense Refill   fluticasone (FLONASE) 50 MCG/ACT nasal spray Place 2 sprays into both nostrils daily. 16 g 6   tadalafil (CIALIS) 5 MG tablet Take 1 tablet (5 mg total) by mouth daily. 90 tablet 3   testosterone cypionate (DEPOTESTOSTERONE CYPIONATE) 200 MG/ML injection Inject 1.5 mLs (300 mg total) into the muscle every 14 (fourteen) days. 10 mL 1   traZODone (DESYREL) 50 MG tablet Take 0.5-1 tablets (25-50 mg total) by mouth at bedtime as needed for sleep. 30 tablet 3   No current facility-administered medications on file prior to visit.   Past Medical History:  Diagnosis Date   Hyperlipidemia    History reviewed. No pertinent surgical history.  Family History  Family history unknown: Yes   Social History   Socioeconomic History   Marital status: Married    Spouse name: Not on file   Number of children: Not on file   Years of education: Not on file   Highest education level: Not on file  Occupational History    Employer: [employer]  Tobacco Use   Smoking status: Never   Smokeless tobacco: Never  Substance and Sexual Activity   Alcohol use: Never   Drug use: Never   Sexual activity: Not on file  Other Topics Concern   Not on file  Social History Narrative   Not on file    Social Determinants of Health   Financial Resource Strain: Not on file  Food Insecurity: Not on file  Transportation Needs: Not on file  Physical Activity: Not on file  Stress: Not on file  Social Connections: Not on file    Review of Systems  Constitutional:  Negative for chills and fever.  HENT:  Positive for congestion. Negative for rhinorrhea and sore throat.   Respiratory:  Negative for cough and shortness of breath.   Cardiovascular:  Negative for chest pain and palpitations.  Gastrointestinal:  Negative for abdominal pain, constipation, diarrhea, nausea and vomiting.  Endocrine: Negative for polydipsia, polyphagia and polyuria.  Genitourinary:  Negative for dysuria and urgency.  Musculoskeletal:  Negative for arthralgias, back pain and myalgias.  Neurological:  Negative for dizziness and headaches.  Psychiatric/Behavioral:  Negative for dysphoric mood. The patient is not nervous/anxious.     Objective:  BP 120/60    Pulse 68    Temp (!) 97.4 F (36.3 C)    Resp 18    Ht 6\' 1"  (1.854 m)    Wt 256 lb (116.1 kg)    BMI 33.78 kg/m   BP/Weight 08/29/2021 05/25/2021 02/16/2021  Systolic BP 120 118 128  Diastolic BP 60 78 64  Wt. (Lbs) 256 251 250  BMI 33.78 32.23 32.1    Physical Exam Vitals reviewed.  Constitutional:      Appearance: Normal appearance.  Neck:     Vascular: No carotid bruit.  Cardiovascular:     Rate and Rhythm: Normal rate and regular rhythm.     Heart sounds: Normal heart sounds.  Pulmonary:     Effort: Pulmonary effort is normal.     Breath sounds: Normal breath sounds. No wheezing, rhonchi or rales.  Abdominal:     General: Bowel sounds are normal.     Palpations: Abdomen is soft.     Tenderness: There is no abdominal tenderness.  Neurological:     Mental Status: He is alert.  Psychiatric:        Mood and Affect: Mood normal.        Behavior: Behavior normal.    Diabetic Foot Exam - Simple   No data filed      Lab Results   Component Value Date   WBC 7.1 08/29/2021   HGB 18.3 (H) 08/29/2021   HCT 54.5 (H) 08/29/2021   PLT 236 08/29/2021   GLUCOSE 105 (H) 08/29/2021   CHOL 215 (H) 08/29/2021   TRIG 104 08/29/2021   HDL 46 08/29/2021   LDLCALC 150 (H) 08/29/2021   ALT 25 08/29/2021   AST 19 08/29/2021   NA 137 08/29/2021   K 4.6 08/29/2021   CL 101 08/29/2021   CREATININE 1.01 08/29/2021   BUN 9 08/29/2021   CO2 25 08/29/2021   TSH 3.710 02/16/2021   HGBA1C 5.9 (H) 08/29/2021      Assessment & Plan:   Problem List Items Addressed This Visit       Endocrine   Testicular hypofunction - Primary (Chronic)    Checking testosterone levels.  Not helping.  Keep appt with endocrinology. Recommend decrease testosterone shots 200 mg every other week.       Relevant Orders   Testosterone,Free and Total (Completed)   Impaired fasting glucose    Recommend continue to work on eating healthy diet and exercise.       Relevant Orders   Hemoglobin A1c (Completed)     Other   Mixed hyperlipidemia (Chronic)    Not at goal.  Start on zetia 10 mg daily.  Continue to work on eating a healthy diet and exercise.  Labs drawn today.        Relevant Orders   CBC with Differential/Platelet (Completed)   Comprehensive metabolic panel (Completed)   Lipid panel (Completed)   Polycythemia    Recommend decrease testosterone shots 200 mg every other week.       Class 1 obesity due to excess calories with serious comorbidity and body mass index (BMI) of 33.0 to 33.9 in adult    Recommend continue to work on eating healthy diet and exercise.       . Orders Placed This Encounter  Procedures   CBC with Differential/Platelet   Comprehensive metabolic panel   Hemoglobin A1c   Lipid panel   Testosterone,Free and Total   Cardiovascular Risk Assessment     Follow-up: Return in about 6 months (around 02/26/2022) for cpe fasting.  An After Visit Summary was printed and given to the patient.  Blane Ohara, MD [facility] [phone]

## 2021-08-29 NOTE — Patient Instructions (Signed)
Try cialis 10 to 20 mg one hour before intercoursse. Recommend continue to work on eating healthy diet and exercise.  [facility]: in [place] - for hormonal management.

## 2021-09-02 ENCOUNTER — Other Ambulatory Visit: Payer: Self-pay

## 2021-09-02 LAB — CBC WITH DIFFERENTIAL/PLATELET
Basophils Absolute: 0.1 10*3/uL (ref 0.0–0.2)
Basos: 1 %
EOS (ABSOLUTE): 0.1 10*3/uL (ref 0.0–0.4)
Eos: 1 %
Hematocrit: 54.5 % — ABNORMAL HIGH (ref 37.5–51.0)
Hemoglobin: 18.3 g/dL — ABNORMAL HIGH (ref 13.0–17.7)
Immature Grans (Abs): 0 10*3/uL (ref 0.0–0.1)
Immature Granulocytes: 0 %
Lymphocytes Absolute: 2 10*3/uL (ref 0.7–3.1)
Lymphs: 28 %
MCH: 31 pg (ref 26.6–33.0)
MCHC: 33.6 g/dL (ref 31.5–35.7)
MCV: 92 fL (ref 79–97)
Monocytes Absolute: 0.5 10*3/uL (ref 0.1–0.9)
Monocytes: 7 %
Neutrophils Absolute: 4.5 10*3/uL (ref 1.4–7.0)
Neutrophils: 63 %
Platelets: 236 10*3/uL (ref 150–450)
RBC: 5.91 x10E6/uL — ABNORMAL HIGH (ref 4.14–5.80)
RDW: 13.1 % (ref 11.6–15.4)
WBC: 7.1 10*3/uL (ref 3.4–10.8)

## 2021-09-02 LAB — LIPID PANEL
Chol/HDL Ratio: 4.7 ratio (ref 0.0–5.0)
Cholesterol, Total: 215 mg/dL — ABNORMAL HIGH (ref 100–199)
HDL: 46 mg/dL (ref >39)
LDL Chol Calc (NIH): 150 mg/dL — ABNORMAL HIGH (ref 0–99)
Triglycerides: 104 mg/dL (ref 0–149)
VLDL Cholesterol Cal: 19 mg/dL (ref 5–40)

## 2021-09-02 LAB — COMPREHENSIVE METABOLIC PANEL
ALT: 25 IU/L (ref 0–44)
AST: 19 IU/L (ref 0–40)
Albumin/Globulin Ratio: 1.7 (ref 1.2–2.2)
Albumin: 4.3 g/dL (ref 3.8–4.8)
Alkaline Phosphatase: 61 IU/L (ref 44–121)
BUN/Creatinine Ratio: 9 — ABNORMAL LOW (ref 10–24)
BUN: 9 mg/dL (ref 8–27)
Bilirubin Total: 0.5 mg/dL (ref 0.0–1.2)
CO2: 25 mmol/L (ref 20–29)
Calcium: 9.5 mg/dL (ref 8.6–10.2)
Chloride: 101 mmol/L (ref 96–106)
Creatinine, Ser: 1.01 mg/dL (ref 0.76–1.27)
Globulin, Total: 2.5 g/dL (ref 1.5–4.5)
Glucose: 105 mg/dL — ABNORMAL HIGH (ref 70–99)
Potassium: 4.6 mmol/L (ref 3.5–5.2)
Sodium: 137 mmol/L (ref 134–144)
Total Protein: 6.8 g/dL (ref 6.0–8.5)
eGFR: 84 mL/min/{1.73_m2} (ref >59)

## 2021-09-02 LAB — TESTOSTERONE,FREE AND TOTAL
Testosterone, Free: 37.6 pg/mL — ABNORMAL HIGH (ref 6.6–18.1)
Testosterone: 1270 ng/dL — ABNORMAL HIGH (ref 264–916)

## 2021-09-02 LAB — HEMOGLOBIN A1C
Est. average glucose Bld gHb Est-mCnc: 123 mg/dL
Hgb A1c MFr Bld: 5.9 % — ABNORMAL HIGH (ref 4.8–5.6)

## 2021-09-02 MED ORDER — EZETIMIBE 10 MG PO TABS: 10.0000 mg | ORAL_TABLET | Freq: Every day | ORAL | 2 refills | Status: DC

## 2021-09-05 ENCOUNTER — Encounter: Payer: Self-pay | Admitting: Family Medicine

## 2021-09-05 NOTE — Assessment & Plan Note (Signed)
Recommend continue to work on eating healthy diet and exercise.  

## 2021-09-05 NOTE — Assessment & Plan Note (Signed)
Recommend decrease testosterone shots 200 mg every other week.

## 2021-09-05 NOTE — Assessment & Plan Note (Signed)
Not at goal.  Start on zetia 10 mg daily.  Continue to work on eating a healthy diet and exercise.  Labs drawn today.

## 2021-09-05 NOTE — Assessment & Plan Note (Addendum)
Checking testosterone levels.  Not helping.  Keep appt with endocrinology. Recommend decrease testosterone shots 200 mg every other week.

## 2021-09-07 ENCOUNTER — Other Ambulatory Visit: Payer: Self-pay

## 2021-09-07 DIAGNOSIS — E291 Testicular hypofunction: Secondary | ICD-10-CM

## 2021-09-07 MED ORDER — TESTOSTERONE CYPIONATE 200 MG/ML IM SOLN: 200.0000 mg | INTRAMUSCULAR | 1 refills | Status: DC

## 2021-09-28 ENCOUNTER — Encounter: Payer: Self-pay | Admitting: Family Medicine

## 2021-09-28 DIAGNOSIS — Z85828 Personal history of other malignant neoplasm of skin: Secondary | ICD-10-CM | POA: Diagnosis not present

## 2021-09-28 DIAGNOSIS — L814 Other melanin hyperpigmentation: Secondary | ICD-10-CM | POA: Diagnosis not present

## 2021-09-28 DIAGNOSIS — D2239 Melanocytic nevi of other parts of face: Secondary | ICD-10-CM | POA: Diagnosis not present

## 2021-09-28 DIAGNOSIS — D225 Melanocytic nevi of trunk: Secondary | ICD-10-CM | POA: Diagnosis not present

## 2021-09-28 DIAGNOSIS — D2339 Other benign neoplasm of skin of other parts of face: Secondary | ICD-10-CM | POA: Diagnosis not present

## 2021-09-28 DIAGNOSIS — C44219 Basal cell carcinoma of skin of left ear and external auricular canal: Secondary | ICD-10-CM | POA: Diagnosis not present

## 2021-10-03 DIAGNOSIS — H903 Sensorineural hearing loss, bilateral: Secondary | ICD-10-CM | POA: Diagnosis not present

## 2021-10-20 DIAGNOSIS — D751 Secondary polycythemia: Secondary | ICD-10-CM | POA: Diagnosis not present

## 2021-10-20 DIAGNOSIS — Z832 Family history of diseases of the blood and blood-forming organs and certain disorders involving the immune mechanism: Secondary | ICD-10-CM | POA: Diagnosis not present

## 2021-10-20 DIAGNOSIS — E291 Testicular hypofunction: Secondary | ICD-10-CM | POA: Diagnosis not present

## 2021-10-20 DIAGNOSIS — R6882 Decreased libido: Secondary | ICD-10-CM | POA: Diagnosis not present

## 2021-10-21 DIAGNOSIS — E291 Testicular hypofunction: Secondary | ICD-10-CM | POA: Diagnosis not present

## 2021-10-21 DIAGNOSIS — R5383 Other fatigue: Secondary | ICD-10-CM | POA: Diagnosis not present

## 2021-10-21 DIAGNOSIS — D751 Secondary polycythemia: Secondary | ICD-10-CM | POA: Diagnosis not present

## 2021-10-31 DIAGNOSIS — E230 Hypopituitarism: Secondary | ICD-10-CM | POA: Diagnosis not present

## 2021-10-31 DIAGNOSIS — Z800 Family history of malignant neoplasm of digestive organs: Secondary | ICD-10-CM | POA: Diagnosis not present

## 2021-10-31 DIAGNOSIS — D751 Secondary polycythemia: Secondary | ICD-10-CM | POA: Diagnosis not present

## 2021-10-31 DIAGNOSIS — E291 Testicular hypofunction: Secondary | ICD-10-CM | POA: Diagnosis not present

## 2021-11-01 ENCOUNTER — Other Ambulatory Visit: Payer: Self-pay | Admitting: Internal Medicine

## 2021-11-01 DIAGNOSIS — Z87898 Personal history of other specified conditions: Secondary | ICD-10-CM

## 2021-11-01 DIAGNOSIS — E230 Hypopituitarism: Secondary | ICD-10-CM

## 2021-11-09 DIAGNOSIS — C44219 Basal cell carcinoma of skin of left ear and external auricular canal: Secondary | ICD-10-CM | POA: Diagnosis not present

## 2021-11-21 ENCOUNTER — Ambulatory Visit
Admission: RE | Admit: 2021-11-21 | Discharge: 2021-11-21 | Disposition: A | Discharge status: Home / Self Care | Payer: BC Managed Care – PPO | Source: Ambulatory Visit | Attending: Internal Medicine | Admitting: Internal Medicine

## 2021-11-21 DIAGNOSIS — L570 Actinic keratosis: Secondary | ICD-10-CM | POA: Diagnosis not present

## 2021-11-21 DIAGNOSIS — E230 Hypopituitarism: Secondary | ICD-10-CM | POA: Diagnosis not present

## 2021-11-21 DIAGNOSIS — Z87898 Personal history of other specified conditions: Secondary | ICD-10-CM

## 2021-11-21 DIAGNOSIS — R519 Headache, unspecified: Secondary | ICD-10-CM | POA: Diagnosis not present

## 2021-11-21 MED ORDER — GADOBENATE DIMEGLUMINE 529 MG/ML IV SOLN
10.0000 mL | Freq: Once | INTRAVENOUS | Status: AC | PRN
  Administered 2021-11-21: 10 mL via INTRAVENOUS

## 2021-11-22 ENCOUNTER — Other Ambulatory Visit: Payer: Self-pay | Admitting: Family Medicine

## 2021-11-22 DIAGNOSIS — E291 Testicular hypofunction: Secondary | ICD-10-CM

## 2021-12-22 DIAGNOSIS — D751 Secondary polycythemia: Secondary | ICD-10-CM | POA: Diagnosis not present

## 2021-12-22 DIAGNOSIS — E230 Hypopituitarism: Secondary | ICD-10-CM | POA: Diagnosis not present

## 2021-12-28 ENCOUNTER — Encounter: Payer: Self-pay | Admitting: Family Medicine

## 2021-12-28 ENCOUNTER — Ambulatory Visit
Admission: RE | Admit: 2021-12-28 | Discharge: 2021-12-28 | Disposition: A | Discharge status: Home / Self Care | Payer: BC Managed Care – PPO | Source: Ambulatory Visit | Attending: Family Medicine | Admitting: Family Medicine

## 2021-12-28 ENCOUNTER — Ambulatory Visit (INDEPENDENT_AMBULATORY_CARE_PROVIDER_SITE_OTHER): Payer: BC Managed Care – PPO | Admitting: Family Medicine

## 2021-12-28 VITALS — BP 122/74 | HR 71 | Temp 98.2°F | Resp 18 | Ht 74.0 in | Wt 254.2 lb

## 2021-12-28 DIAGNOSIS — M5136 Other intervertebral disc degeneration, lumbar region: Secondary | ICD-10-CM | POA: Diagnosis not present

## 2021-12-28 DIAGNOSIS — M5440 Lumbago with sciatica, unspecified side: Secondary | ICD-10-CM

## 2021-12-28 MED ORDER — CYCLOBENZAPRINE HCL 5 MG PO TABS: 5.0000 mg | ORAL_TABLET | Freq: Three times a day (TID) | ORAL | 0 refills | Status: DC | PRN

## 2021-12-28 MED ORDER — PREDNISONE 50 MG PO TABS
50.0000 mg | ORAL_TABLET | Freq: Every day | ORAL | 0 refills | Status: DC
Start: 2021-12-28 — End: 2022-01-18

## 2021-12-28 NOTE — Patient Instructions (Signed)
Prednisone 50 mg once daily with food in am x 5 days.  Flexeril 5 mg one three times a day as needed for back pain May use tylenol (acetaminophen as directed on bottle.  Refer to physical therapy.  Order lumbar xray: [place] on [employer].

## 2021-12-28 NOTE — Assessment & Plan Note (Addendum)
Prednisone 50 mg once daily with food in am x 5 days.  Flexeril 5 mg one three times a day as needed for back pain May use tylenol (acetaminophen as directed on bottle.  Refer to physical therapy.  Order lumbar xray: [facility] on [address]. Patient to call back if not improving.  At that time we will consider MRI.

## 2021-12-28 NOTE — Progress Notes (Signed)
Acute Office Visit  Subjective:    Patient ID: James Craig, male    DOB: 05/01/1956, 66 y.o.   MRN: [id number]  Chief Complaint  Patient presents with   Leg Pain    Right leg, begins at buttock    HPI: Patient is in today for lumbar back pain with burning down his right leg.  This been going on for approximately 6 weeks.  He saw a massage therapist and then he subsequently saw a chiropractor.  Neither has significantly helped him and has worsened in the last week.  He is having trouble sleeping due to due to the burning in his leg.  He has tried Advil and ibuprofen which he took for about 4 days regularly but it upset his stomach.  Past Medical History:  Diagnosis Date   Hyperlipidemia     History reviewed. No pertinent surgical history.  Family History  Family history unknown: Yes    Social History   Socioeconomic History   Marital status: Married    Spouse name: Not on file   Number of children: Not on file   Years of education: Not on file   Highest education level: Not on file  Occupational History    Employer: [employer]  Tobacco Use   Smoking status: Never   Smokeless tobacco: Never  Substance and Sexual Activity   Alcohol use: Never   Drug use: Never   Sexual activity: Not on file  Other Topics Concern   Not on file  Social History Narrative   Not on file   Social Determinants of Health   Financial Resource Strain: Not on file  Food Insecurity: Not on file  Transportation Needs: Not on file  Physical Activity: Not on file  Stress: Not on file  Social Connections: Not on file  Intimate Partner Violence: Not on file    Outpatient Medications Prior to Visit  Medication Sig Dispense Refill   ezetimibe (ZETIA) 10 MG tablet Take 1 tablet (10 mg total) by mouth daily. 30 tablet 2   fluticasone (FLONASE) 50 MCG/ACT nasal spray Place 2 sprays into both nostrils daily. 16 g 6   tadalafil (CIALIS) 5 MG tablet Take 1 tablet (5 mg total) by mouth  daily. 90 tablet 3   testosterone cypionate (DEPOTESTOSTERONE CYPIONATE) 200 MG/ML injection INJECT 1.5 ML INTRAMUSCULARLY EVERY 14 DAYS 10 mL 1   traZODone (DESYREL) 50 MG tablet Take 0.5-1 tablets (25-50 mg total) by mouth at bedtime as needed for sleep. 30 tablet 3   No facility-administered medications prior to visit.    Allergies  Allergen Reactions   Penicillins Hives   Statins     Muscle pain     Review of Systems  Constitutional:  Negative for appetite change, fatigue and fever.  HENT:  Negative for congestion, ear pain, sinus pressure and sore throat.   Eyes:  Negative for pain.  Respiratory:  Negative for cough, shortness of breath and wheezing.   Cardiovascular:  Negative for chest pain and palpitations.  Gastrointestinal:  Positive for constipation. Negative for abdominal pain, diarrhea, nausea and vomiting.  Genitourinary:  Negative for dysuria and frequency.  Musculoskeletal:  Positive for back pain and myalgias. Negative for arthralgias and joint swelling.  Skin:  Negative for rash.  Neurological:  Negative for dizziness, weakness and headaches.  Psychiatric/Behavioral:  Negative for dysphoric mood. The patient is not nervous/anxious.        Objective:    Physical Exam Vitals reviewed.  Constitutional:  Appearance: Normal appearance.     Comments: Uncomfortable.  Musculoskeletal:        General: Tenderness (Midline lumbar and right lumbar.) present.     Comments: Positive right straight leg raise.  Negative left straight leg raise.  Neurological:     Mental Status: He is alert.     Gait: Gait abnormal (antalgic.).     Deep Tendon Reflexes:     Reflex Scores:      Patellar reflexes are 2+ on the right side and 2+ on the left side.    Comments: Right foot numb     BP 122/74   Pulse 71   Temp 98.2 F (36.8 C)   Resp 18   Ht 6\' 2"  (1.88 m)   Wt 254 lb 3.2 oz (115.3 kg)   SpO2 97%   BMI 32.64 kg/m  Wt Readings from Last 3 Encounters:  12/28/21  254 lb 3.2 oz (115.3 kg)  08/29/21 256 lb (116.1 kg)  05/25/21 251 lb (113.9 kg)    Health Maintenance Due  Topic Date Due   HIV Screening  Never done   Hepatitis C Screening  Never done   COVID-19 Vaccine (3 - Pfizer series) 12/02/2019    There are no preventive care reminders to display for this patient.   Lab Results  Component Value Date   TSH 3.710 02/16/2021   Lab Results  Component Value Date   WBC 7.1 08/29/2021   HGB 18.3 (H) 08/29/2021   HCT 54.5 (H) 08/29/2021   MCV 92 08/29/2021   PLT 236 08/29/2021   Lab Results  Component Value Date   NA 137 08/29/2021   K 4.6 08/29/2021   CO2 25 08/29/2021   GLUCOSE 105 (H) 08/29/2021   BUN 9 08/29/2021   CREATININE 1.01 08/29/2021   BILITOT 0.5 08/29/2021   ALKPHOS 61 08/29/2021   AST 19 08/29/2021   ALT 25 08/29/2021   PROT 6.8 08/29/2021   ALBUMIN 4.3 08/29/2021   CALCIUM 9.5 08/29/2021   EGFR 84 08/29/2021   Lab Results  Component Value Date   CHOL 215 (H) 08/29/2021   Lab Results  Component Value Date   HDL 46 08/29/2021   Lab Results  Component Value Date   LDLCALC 150 (H) 08/29/2021   Lab Results  Component Value Date   TRIG 104 08/29/2021   Lab Results  Component Value Date   CHOLHDL 4.7 08/29/2021   Lab Results  Component Value Date   HGBA1C 5.9 (H) 08/29/2021       Assessment & Plan:   Problem List Items Addressed This Visit       Nervous and Auditory   Back pain of lumbar region with sciatica - Primary    Prednisone 50 mg once daily with food in am x 5 days.  Flexeril 5 mg one three times a day as needed for back pain May use tylenol (acetaminophen as directed on bottle.  Refer to physical therapy.  Order lumbar xray: [facility] on [address]. Patient to call back if not improving.  At that time we will consider MRI.      Relevant Medications   predniSONE (DELTASONE) 50 MG tablet   cyclobenzaprine (FLEXERIL) 5 MG tablet   Other Relevant Orders   DG Lumbar  Spine Complete   Ambulatory referral to Physical Therapy   Meds ordered this encounter  Medications   predniSONE (DELTASONE) 50 MG tablet    Sig: Take 1 tablet (50 mg total) by mouth  daily with breakfast.    Dispense:  5 tablet    Refill:  0   cyclobenzaprine (FLEXERIL) 5 MG tablet    Sig: Take 1 tablet (5 mg total) by mouth 3 (three) times daily as needed for muscle spasms.    Dispense:  90 tablet    Refill:  0    Orders Placed This Encounter  Procedures   DG Lumbar Spine Complete   Ambulatory referral to Physical Therapy     Follow-up: Return in about 3 weeks (around 01/18/2022) for sciatica.  An After Visit Summary was printed and given to the patient.  I,Jeral Zick,acting as a Neurosurgeon for Blane Ohara, MD.,have documented all relevant documentation on the behalf of Blane Ohara, MD,as directed by  Blane Ohara, MD while in the presence of Blane Ohara, MD.   Blane Ohara, MD [facility] [phone]

## 2022-01-03 DIAGNOSIS — E291 Testicular hypofunction: Secondary | ICD-10-CM | POA: Diagnosis not present

## 2022-01-05 DIAGNOSIS — M2560 Stiffness of unspecified joint, not elsewhere classified: Secondary | ICD-10-CM | POA: Diagnosis not present

## 2022-01-05 DIAGNOSIS — M5440 Lumbago with sciatica, unspecified side: Secondary | ICD-10-CM | POA: Diagnosis not present

## 2022-01-11 DIAGNOSIS — M5440 Lumbago with sciatica, unspecified side: Secondary | ICD-10-CM | POA: Diagnosis not present

## 2022-01-11 DIAGNOSIS — M2560 Stiffness of unspecified joint, not elsewhere classified: Secondary | ICD-10-CM | POA: Diagnosis not present

## 2022-01-13 DIAGNOSIS — M5440 Lumbago with sciatica, unspecified side: Secondary | ICD-10-CM | POA: Diagnosis not present

## 2022-01-13 DIAGNOSIS — M2560 Stiffness of unspecified joint, not elsewhere classified: Secondary | ICD-10-CM | POA: Diagnosis not present

## 2022-01-16 DIAGNOSIS — M2560 Stiffness of unspecified joint, not elsewhere classified: Secondary | ICD-10-CM | POA: Diagnosis not present

## 2022-01-16 DIAGNOSIS — M5440 Lumbago with sciatica, unspecified side: Secondary | ICD-10-CM | POA: Diagnosis not present

## 2022-01-18 ENCOUNTER — Ambulatory Visit (INDEPENDENT_AMBULATORY_CARE_PROVIDER_SITE_OTHER): Payer: BC Managed Care – PPO | Admitting: Family Medicine

## 2022-01-18 VITALS — BP 110/60 | HR 76 | Temp 97.6°F | Resp 18 | Ht 74.0 in | Wt 256.0 lb

## 2022-01-18 DIAGNOSIS — M5440 Lumbago with sciatica, unspecified side: Secondary | ICD-10-CM

## 2022-01-18 MED ORDER — MELOXICAM 15 MG PO TABS: 15.0000 mg | ORAL_TABLET | Freq: Every day | ORAL | 0 refills | Status: DC

## 2022-01-18 NOTE — Progress Notes (Signed)
Acute Office Visit  Subjective:    Patient ID: James Craig, male    DOB: 1955/07/26, 66 y.o.   MRN: [id number]  Chief Complaint  Patient presents with   Back Pain    HPI: Patient is in today for lumbar back pain with burning down his right leg. He completed the prednisone with some improvement and he is going to physical therapy twice weekly.  Failing on conservative management.   Past Medical History:  Diagnosis Date   Hyperlipidemia     History reviewed. No pertinent surgical history.  Family History  Family history unknown: Yes    Social History   Socioeconomic History   Marital status: Married    Spouse name: Not on file   Number of children: Not on file   Years of education: Not on file   Highest education level: Not on file  Occupational History    Employer: [employer]  Tobacco Use   Smoking status: Never   Smokeless tobacco: Never  Substance and Sexual Activity   Alcohol use: Never   Drug use: Never   Sexual activity: Not on file  Other Topics Concern   Not on file  Social History Narrative   Not on file   Social Determinants of Health   Financial Resource Strain: Not on file  Food Insecurity: Not on file  Transportation Needs: Not on file  Physical Activity: Not on file  Stress: Not on file  Social Connections: Not on file  Intimate Partner Violence: Not on file    Outpatient Medications Prior to Visit  Medication Sig Dispense Refill   cyclobenzaprine (FLEXERIL) 5 MG tablet Take 1 tablet (5 mg total) by mouth 3 (three) times daily as needed for muscle spasms. 90 tablet 0   ezetimibe (ZETIA) 10 MG tablet Take 1 tablet (10 mg total) by mouth daily. 30 tablet 2   fluticasone (FLONASE) 50 MCG/ACT nasal spray Place 2 sprays into both nostrils daily. 16 g 6   tadalafil (CIALIS) 5 MG tablet Take 1 tablet (5 mg total) by mouth daily. 90 tablet 3   testosterone cypionate (DEPOTESTOSTERONE CYPIONATE) 200 MG/ML injection INJECT 1.5 ML  INTRAMUSCULARLY EVERY 14 DAYS 10 mL 1   traZODone (DESYREL) 50 MG tablet Take 0.5-1 tablets (25-50 mg total) by mouth at bedtime as needed for sleep. 30 tablet 3   predniSONE (DELTASONE) 50 MG tablet Take 1 tablet (50 mg total) by mouth daily with breakfast. 5 tablet 0   No facility-administered medications prior to visit.    Allergies  Allergen Reactions   Penicillins Hives   Statins     Muscle pain     Review of Systems  Respiratory:  Negative for shortness of breath.   Cardiovascular:  Negative for chest pain.  Musculoskeletal:  Positive for back pain (radiating pain in right hip and leg).  Neurological:  Positive for weakness.      Objective:    Physical Exam Vitals reviewed.  Constitutional:      Appearance: Normal appearance.  Cardiovascular:     Rate and Rhythm: Normal rate and regular rhythm.     Heart sounds: Normal heart sounds.  Pulmonary:     Effort: Pulmonary effort is normal.     Breath sounds: Normal breath sounds.  Musculoskeletal:        General: Tenderness (right lumbar with rt sided SLR. left SLR is negative.) present.  Neurological:     Mental Status: He is alert.     BP 110/60  Pulse 76   Temp 97.6 F (36.4 C)   Resp 18   Ht 6\' 2"  (1.88 m)   Wt 256 lb (116.1 kg)   BMI 32.87 kg/m  Wt Readings from Last 3 Encounters:  01/18/22 256 lb (116.1 kg)  12/28/21 254 lb 3.2 oz (115.3 kg)  08/29/21 256 lb (116.1 kg)    Health Maintenance Due  Topic Date Due   HIV Screening  Never done   Hepatitis C Screening  Never done   COVID-19 Vaccine (3 - Pfizer series) 12/02/2019    There are no preventive care reminders to display for this patient.   Lab Results  Component Value Date   TSH 3.710 02/16/2021   Lab Results  Component Value Date   WBC 7.1 08/29/2021   HGB 18.3 (H) 08/29/2021   HCT 54.5 (H) 08/29/2021   MCV 92 08/29/2021   PLT 236 08/29/2021   Lab Results  Component Value Date   NA 137 08/29/2021   K 4.6 08/29/2021   CO2 25  08/29/2021   GLUCOSE 105 (H) 08/29/2021   BUN 9 08/29/2021   CREATININE 1.01 08/29/2021   BILITOT 0.5 08/29/2021   ALKPHOS 61 08/29/2021   AST 19 08/29/2021   ALT 25 08/29/2021   PROT 6.8 08/29/2021   ALBUMIN 4.3 08/29/2021   CALCIUM 9.5 08/29/2021   EGFR 84 08/29/2021   Lab Results  Component Value Date   CHOL 215 (H) 08/29/2021   Lab Results  Component Value Date   HDL 46 08/29/2021   Lab Results  Component Value Date   LDLCALC 150 (H) 08/29/2021   Lab Results  Component Value Date   TRIG 104 08/29/2021   Lab Results  Component Value Date   CHOLHDL 4.7 08/29/2021   Lab Results  Component Value Date   HGBA1C 5.9 (H) 08/29/2021       Assessment & Plan:   Problem List Items Addressed This Visit       Nervous and Auditory   Back pain of lumbar region with sciatica - Primary    Start on meloxicam 15 mg once daily. Continue flexeril.  Will hold on physical therapy as pt is not improving.  Failing conservative treatment. Order lumbar mri.         Relevant Medications   meloxicam (MOBIC) 15 MG tablet   Other Relevant Orders   MR Lumbar Spine Wo Contrast (Completed)   Meds ordered this encounter  Medications   meloxicam (MOBIC) 15 MG tablet    Sig: Take 1 tablet (15 mg total) by mouth daily.    Dispense:  30 tablet    Refill:  0    Orders Placed This Encounter  Procedures   MR Lumbar Spine Wo Contrast     Follow-up: Return if symptoms worsen or fail to improve.  An After Visit Summary was printed and given to the patient.  Blane Ohara, MD [facility] [phone]

## 2022-01-19 DIAGNOSIS — M5440 Lumbago with sciatica, unspecified side: Secondary | ICD-10-CM | POA: Diagnosis not present

## 2022-01-19 DIAGNOSIS — M2560 Stiffness of unspecified joint, not elsewhere classified: Secondary | ICD-10-CM | POA: Diagnosis not present

## 2022-01-20 NOTE — Assessment & Plan Note (Addendum)
Start on meloxicam 15 mg once daily. Continue flexeril.  Will hold on physical therapy as pt is not improving.  Failing conservative treatment. Order lumbar mri.

## 2022-01-22 ENCOUNTER — Ambulatory Visit
Admission: RE | Admit: 2022-01-22 | Discharge: 2022-01-22 | Disposition: A | Discharge status: Home / Self Care | Payer: BC Managed Care – PPO | Source: Ambulatory Visit | Attending: Family Medicine | Admitting: Family Medicine

## 2022-01-22 DIAGNOSIS — R200 Anesthesia of skin: Secondary | ICD-10-CM | POA: Diagnosis not present

## 2022-01-22 DIAGNOSIS — M5440 Lumbago with sciatica, unspecified side: Secondary | ICD-10-CM

## 2022-01-22 DIAGNOSIS — M5416 Radiculopathy, lumbar region: Secondary | ICD-10-CM | POA: Diagnosis not present

## 2022-01-22 DIAGNOSIS — M48061 Spinal stenosis, lumbar region without neurogenic claudication: Secondary | ICD-10-CM | POA: Diagnosis not present

## 2022-01-22 DIAGNOSIS — M4126 Other idiopathic scoliosis, lumbar region: Secondary | ICD-10-CM | POA: Diagnosis not present

## 2022-01-23 ENCOUNTER — Encounter: Payer: Self-pay | Admitting: Family Medicine

## 2022-01-24 ENCOUNTER — Other Ambulatory Visit: Payer: Self-pay

## 2022-01-24 DIAGNOSIS — M5126 Other intervertebral disc displacement, lumbar region: Secondary | ICD-10-CM

## 2022-02-01 DIAGNOSIS — M7130 Other bursal cyst, unspecified site: Secondary | ICD-10-CM | POA: Diagnosis not present

## 2022-02-01 DIAGNOSIS — Z6832 Body mass index (BMI) 32.0-32.9, adult: Secondary | ICD-10-CM | POA: Diagnosis not present

## 2022-02-01 DIAGNOSIS — M5416 Radiculopathy, lumbar region: Secondary | ICD-10-CM | POA: Diagnosis not present

## 2022-02-05 ENCOUNTER — Other Ambulatory Visit: Payer: Self-pay | Admitting: Family Medicine

## 2022-02-05 MED ORDER — TESTOSTERONE 20.25 MG/ACT (1.62%) TD GEL: 2.0000 | Freq: Every day | TRANSDERMAL | 2 refills | Status: DC

## 2022-02-17 ENCOUNTER — Other Ambulatory Visit: Payer: Self-pay | Admitting: Family Medicine

## 2022-02-17 ENCOUNTER — Telehealth: Payer: Self-pay

## 2022-02-17 NOTE — Telephone Encounter (Signed)
Al called with concerns about the testosterone gel.  The medication was discussed with his wife's endocrinologist and they preferred that the medication be given in a different form.

## 2022-02-19 ENCOUNTER — Other Ambulatory Visit: Payer: Self-pay | Admitting: Family Medicine

## 2022-02-19 MED ORDER — TESTOSTERONE CYPIONATE 200 MG/ML IJ SOLN
200.0000 mg | INTRAMUSCULAR | 1 refills | Status: DC
Start: 2022-02-19 — End: 2022-11-03

## 2022-02-20 ENCOUNTER — Other Ambulatory Visit: Payer: Self-pay | Admitting: Family Medicine

## 2022-02-20 NOTE — Telephone Encounter (Signed)
Patient made aware.  Lorita Officer, [place] 02/20/22 8:24 AM

## 2022-03-01 ENCOUNTER — Encounter: Payer: BC Managed Care – PPO | Admitting: Family Medicine

## 2022-03-01 DIAGNOSIS — M5416 Radiculopathy, lumbar region: Secondary | ICD-10-CM | POA: Diagnosis not present

## 2022-03-01 DIAGNOSIS — M7130 Other bursal cyst, unspecified site: Secondary | ICD-10-CM | POA: Diagnosis not present

## 2022-03-21 DIAGNOSIS — M5416 Radiculopathy, lumbar region: Secondary | ICD-10-CM | POA: Diagnosis not present

## 2022-03-27 ENCOUNTER — Other Ambulatory Visit: Payer: Self-pay

## 2022-03-27 MED ORDER — INDOMETHACIN 50 MG PO CAPS: 50.0000 mg | ORAL_CAPSULE | Freq: Two times a day (BID) | ORAL | 0 refills | Status: DC

## 2022-03-27 NOTE — Telephone Encounter (Signed)
Al called with a gout flare up and requested indomethacin.  Dr. Tobie Poet approved medication.  RX sent to [facility].

## 2022-04-10 DIAGNOSIS — M5416 Radiculopathy, lumbar region: Secondary | ICD-10-CM | POA: Diagnosis not present

## 2022-04-10 DIAGNOSIS — M48062 Spinal stenosis, lumbar region with neurogenic claudication: Secondary | ICD-10-CM | POA: Diagnosis not present

## 2022-04-10 DIAGNOSIS — M7138 Other bursal cyst, other site: Secondary | ICD-10-CM | POA: Diagnosis not present

## 2022-04-10 HISTORY — PX: OTHER SURGICAL HISTORY: SHX169

## 2022-04-28 ENCOUNTER — Encounter: Payer: BC Managed Care – PPO | Admitting: Family Medicine

## 2022-05-03 ENCOUNTER — Encounter: Payer: Self-pay | Admitting: Family Medicine

## 2022-05-03 DIAGNOSIS — Z125 Encounter for screening for malignant neoplasm of prostate: Secondary | ICD-10-CM | POA: Insufficient documentation

## 2022-05-03 DIAGNOSIS — Z0000 Encounter for general adult medical examination without abnormal findings: Secondary | ICD-10-CM | POA: Insufficient documentation

## 2022-05-03 NOTE — Assessment & Plan Note (Signed)
Things to do to keep yourself healthy  - Exercise at least 30-45 minutes a day, 3-4 days a week.  - Eat a low-fat diet with lots of fruits and vegetables, up to 7-9 servings per day.  - Seatbelts can save your life. Wear them always.  - Smoke detectors on every level of your home, check batteries every year.  - Eye Doctor - have an eye exam every 1-2 years  - Alcohol -  If you drink, do it moderately, less than 2 drinks per day.  - Health Care Power of Attorney. Choose someone to speak for you if you are not able.  - Depression is common in our stressful world.If you're feeling down or losing interest in things you normally enjoy, please come in for a visit.  

## 2022-05-03 NOTE — Assessment & Plan Note (Signed)
Check a1c level

## 2022-05-03 NOTE — Patient Instructions (Signed)
Things to do to keep yourself healthy  - Exercise at least 30-45 minutes a day, 3-4 days a week.  - Eat a low-fat diet with lots of fruits and vegetables, up to 7-9 servings per day.  - Seatbelts can save your life. Wear them always.  - Smoke detectors on every level of your home, check batteries every year.  - Eye Doctor - have an eye exam every 1-2 years  - Alcohol -  If you drink, do it moderately, less than 2 drinks per day.  - Health Care Power of Attorney. Choose someone to speak for you if you are not able.  - Depression is common in our stressful world.If you're feeling down or losing interest in things you normally enjoy, please come in for a visit.  

## 2022-05-03 NOTE — Progress Notes (Unsigned)
Subjective:  Patient ID: James Craig, male    DOB: [birth year]  Age: 66 y.o. MRN: [id number]  No chief complaint on file.   HPI  Well Adult Physical: Patient here for a comprehensive physical exam.The patient reports {problems:16946} Do you take any herbs or supplements that were not prescribed by a doctor? {yes/no/not asked:9010} Are you taking calcium supplements? {yes/no:63} Are you taking aspirin daily? {yes/no:63}  Encounter for general adult medical examination without abnormal findings  Physical ("At Risk" items are starred): Patient's last physical exam was 1 year ago .  Patient wears a seat belt, has smoke detectors, has carbon monoxide detectors, practices appropriate gun safety, and wears sunscreen with extended sun exposure. Dental Care: biannual cleanings, brushes and flosses daily. Ophthalmology/Optometry: Annual visit.  Hearing loss: none Vision impairments: none Last PSA:  Flowsheet Row Office Visit from 02/16/2021 in [facility]  PHQ-2 Total Score 0               Social History   Socioeconomic History   Marital status: Married    Spouse name: Not on file   Number of children: Not on file   Years of education: Not on file   Highest education level: Not on file  Occupational History    Employer: [employer]  Tobacco Use   Smoking status: Never   Smokeless tobacco: Never  Substance and Sexual Activity   Alcohol use: Never   Drug use: Never   Sexual activity: Not on file  Other Topics Concern   Not on file  Social History Narrative   Not on file   Social Determinants of Health   Financial Resource Strain: Not on file  Food Insecurity: Not on file  Transportation Needs: Not on file  Physical Activity: Not on file  Stress: Not on file  Social Connections: Not on file   Past Medical History:  Diagnosis Date   Hyperlipidemia    History reviewed. No pertinent surgical history.  Family History  Family history unknown: Yes    Social History   Socioeconomic History   Marital status: Married    Spouse name: Not on file   Number of children: Not on file   Years of education: Not on file   Highest education level: Not on file  Occupational History    Employer: [employer]  Tobacco Use   Smoking status: Never   Smokeless tobacco: Never  Substance and Sexual Activity   Alcohol use: Never   Drug use: Never   Sexual activity: Not on file  Other Topics Concern   Not on file  Social History Narrative   Not on file   Social Determinants of Health   Financial Resource Strain: Not on file  Food Insecurity: Not on file  Transportation Needs: Not on file  Physical Activity: Not on file  Stress: Not on file  Social Connections: Not on file   Review of Systems  Constitutional:  Negative for chills, fatigue and fever.  HENT:  Negative for congestion, ear pain and sore throat.   Respiratory:  Negative for cough and shortness of breath.   Cardiovascular:  Negative for chest pain.  Gastrointestinal:  Negative for abdominal pain, constipation, diarrhea, nausea and vomiting.  Endocrine: Negative for polydipsia, polyphagia and polyuria.  Genitourinary:  Negative for dysuria and frequency.  Musculoskeletal:  Negative for arthralgias and myalgias.  Neurological:  Negative for dizziness and headaches.  Psychiatric/Behavioral:  Negative for dysphoric mood.  No dysphoria     Objective:  There were no vitals taken for this visit.     01/18/2022    9:54 AM 12/28/2021    1:41 PM 08/29/2021    8:09 AM  BP/Weight  Systolic BP 110 122 120  Diastolic BP 60 74 60  Wt. (Lbs) 256 254.2 256  BMI 32.87 kg/m2 32.64 kg/m2 33.78 kg/m2    Physical Exam Vitals reviewed.  Constitutional:      Appearance: Normal appearance.  HENT:     Right Ear: Tympanic membrane, ear canal and external ear normal.     Left Ear: Tympanic membrane, ear canal and external ear normal.     Nose: Nose normal. No congestion or  rhinorrhea.     Mouth/Throat:     Mouth: Mucous membranes are moist.     Pharynx: No oropharyngeal exudate or posterior oropharyngeal erythema.  Neck:     Vascular: No carotid bruit.  Cardiovascular:     Rate and Rhythm: Normal rate and regular rhythm.     Pulses: Normal pulses.     Heart sounds: Normal heart sounds.  Pulmonary:     Effort: Pulmonary effort is normal. No respiratory distress.     Breath sounds: Normal breath sounds. No wheezing, rhonchi or rales.  Abdominal:     General: Bowel sounds are normal.     Palpations: Abdomen is soft.     Tenderness: There is no abdominal tenderness.  Lymphadenopathy:     Cervical: No cervical adenopathy.  Neurological:     Mental Status: He is alert.  Psychiatric:        Mood and Affect: Mood normal.        Behavior: Behavior normal.     Lab Results  Component Value Date   WBC 7.1 08/29/2021   HGB 18.3 (H) 08/29/2021   HCT 54.5 (H) 08/29/2021   PLT 236 08/29/2021   GLUCOSE 105 (H) 08/29/2021   CHOL 215 (H) 08/29/2021   TRIG 104 08/29/2021   HDL 46 08/29/2021   LDLCALC 150 (H) 08/29/2021   ALT 25 08/29/2021   AST 19 08/29/2021   NA 137 08/29/2021   K 4.6 08/29/2021   CL 101 08/29/2021   CREATININE 1.01 08/29/2021   BUN 9 08/29/2021   CO2 25 08/29/2021   TSH 3.710 02/16/2021   HGBA1C 5.9 (H) 08/29/2021      Assessment & Plan:   Problem List Items Addressed This Visit       Endocrine   Testicular hypofunction (Chronic)   Impaired fasting glucose    Check a1c level        Other   Well adult exam - Primary    Things to do to keep yourself healthy  - Exercise at least 30-45 minutes a day, 3-4 days a week.  - Eat a low-fat diet with lots of fruits and vegetables, up to 7-9 servings per day.  - Seatbelts can save your life. Wear them always.  - Smoke detectors on every level of your home, check batteries every year.  - Eye Doctor - have an eye exam every 1-2 years  - Alcohol -  If you drink, do it moderately,  less than 2 drinks per day.  - Health Care Power of Attorney. Choose someone to speak for you if you are not able.  - Depression is common in our stressful world.If you're feeling down or losing interest in things you normally enjoy, please come in for a visit.  Prostate cancer screening    There is no height or weight on file to calculate BMI.   These are the goals we discussed:  Goals   None      This is a list of the screening recommended for you and due dates:  Health Maintenance  Topic Date Due   HIV Screening  Never done   Hepatitis C Screening: USPSTF Recommendation to screen - Ages 21-79 yo.  Never done   COVID-19 Vaccine (3 - Pfizer series) 12/02/2019   Flu Shot  02/07/2022   Colon Cancer Screening  06/27/2026   Tetanus Vaccine  04/15/2030   Zoster (Shingles) Vaccine  Completed   HPV Vaccine  Aged Out     No orders of the defined types were placed in this encounter.    Follow-up: No follow-ups on file.  An After Visit Summary was printed and given to the patient.  Blane Ohara, MD [facility] [phone]

## 2022-05-04 ENCOUNTER — Ambulatory Visit (INDEPENDENT_AMBULATORY_CARE_PROVIDER_SITE_OTHER): Payer: BC Managed Care – PPO | Admitting: Family Medicine

## 2022-05-04 ENCOUNTER — Encounter: Payer: Self-pay | Admitting: Family Medicine

## 2022-05-04 VITALS — BP 130/90 | HR 67 | Temp 97.8°F | Ht 74.0 in | Wt 252.6 lb

## 2022-05-04 DIAGNOSIS — Z114 Encounter for screening for human immunodeficiency virus [HIV]: Secondary | ICD-10-CM

## 2022-05-04 DIAGNOSIS — J0100 Acute maxillary sinusitis, unspecified: Secondary | ICD-10-CM

## 2022-05-04 DIAGNOSIS — Z1159 Encounter for screening for other viral diseases: Secondary | ICD-10-CM

## 2022-05-04 DIAGNOSIS — Z125 Encounter for screening for malignant neoplasm of prostate: Secondary | ICD-10-CM

## 2022-05-04 DIAGNOSIS — Z0000 Encounter for general adult medical examination without abnormal findings: Secondary | ICD-10-CM | POA: Diagnosis not present

## 2022-05-04 DIAGNOSIS — Z23 Encounter for immunization: Secondary | ICD-10-CM

## 2022-05-04 DIAGNOSIS — R7301 Impaired fasting glucose: Secondary | ICD-10-CM | POA: Diagnosis not present

## 2022-05-04 DIAGNOSIS — E291 Testicular hypofunction: Secondary | ICD-10-CM | POA: Diagnosis not present

## 2022-05-04 DIAGNOSIS — J019 Acute sinusitis, unspecified: Secondary | ICD-10-CM | POA: Insufficient documentation

## 2022-05-04 MED ORDER — AZITHROMYCIN 500 MG PO TABS: 500.0000 mg | ORAL_TABLET | Freq: Every day | ORAL | 0 refills | Status: DC

## 2022-05-04 NOTE — Assessment & Plan Note (Signed)
zpack 500 mg daily x 3 days.

## 2022-05-05 LAB — COMPREHENSIVE METABOLIC PANEL
ALT: 27 IU/L (ref 0–44)
AST: 23 IU/L (ref 0–40)
Albumin/Globulin Ratio: 1.9 (ref 1.2–2.2)
Albumin: 4.8 g/dL (ref 3.9–4.9)
Alkaline Phosphatase: 70 IU/L (ref 44–121)
BUN/Creatinine Ratio: 9 — ABNORMAL LOW (ref 10–24)
BUN: 9 mg/dL (ref 8–27)
Bilirubin Total: 0.9 mg/dL (ref 0.0–1.2)
CO2: 23 mmol/L (ref 20–29)
Calcium: 9.6 mg/dL (ref 8.6–10.2)
Chloride: 101 mmol/L (ref 96–106)
Creatinine, Ser: 0.95 mg/dL (ref 0.76–1.27)
Globulin, Total: 2.5 g/dL (ref 1.5–4.5)
Glucose: 107 mg/dL — ABNORMAL HIGH (ref 70–99)
Potassium: 4.5 mmol/L (ref 3.5–5.2)
Sodium: 137 mmol/L (ref 134–144)
Total Protein: 7.3 g/dL (ref 6.0–8.5)
eGFR: 90 mL/min/{1.73_m2} (ref >59)

## 2022-05-05 LAB — LIPID PANEL
Chol/HDL Ratio: 4.3 ratio (ref 0.0–5.0)
Cholesterol, Total: 194 mg/dL (ref 100–199)
HDL: 45 mg/dL (ref >39)
LDL Chol Calc (NIH): 128 mg/dL — ABNORMAL HIGH (ref 0–99)
Triglycerides: 115 mg/dL (ref 0–149)
VLDL Cholesterol Cal: 21 mg/dL (ref 5–40)

## 2022-05-05 LAB — CBC WITH DIFFERENTIAL/PLATELET
Basophils Absolute: 0.1 10*3/uL (ref 0.0–0.2)
Basos: 1 %
EOS (ABSOLUTE): 0.1 10*3/uL (ref 0.0–0.4)
Eos: 1 %
Hematocrit: 54.8 % — ABNORMAL HIGH (ref 37.5–51.0)
Hemoglobin: 18.1 g/dL — ABNORMAL HIGH (ref 13.0–17.7)
Immature Grans (Abs): 0 10*3/uL (ref 0.0–0.1)
Immature Granulocytes: 1 %
Lymphocytes Absolute: 1.7 10*3/uL (ref 0.7–3.1)
Lymphs: 22 %
MCH: 31 pg (ref 26.6–33.0)
MCHC: 33 g/dL (ref 31.5–35.7)
MCV: 94 fL (ref 79–97)
Monocytes Absolute: 0.6 10*3/uL (ref 0.1–0.9)
Monocytes: 8 %
Neutrophils Absolute: 5.5 10*3/uL (ref 1.4–7.0)
Neutrophils: 67 %
Platelets: 262 10*3/uL (ref 150–450)
RBC: 5.84 x10E6/uL — ABNORMAL HIGH (ref 4.14–5.80)
RDW: 12.4 % (ref 11.6–15.4)
WBC: 8 10*3/uL (ref 3.4–10.8)

## 2022-05-05 LAB — HEMOGLOBIN A1C
Est. average glucose Bld gHb Est-mCnc: 120 mg/dL
Hgb A1c MFr Bld: 5.8 % — ABNORMAL HIGH (ref 4.8–5.6)

## 2022-05-05 LAB — HIV ANTIBODY (ROUTINE TESTING W REFLEX): HIV Screen 4th Generation wRfx: NONREACTIVE

## 2022-05-05 LAB — HCV INTERPRETATION

## 2022-05-05 LAB — HCV AB W REFLEX TO QUANT PCR: HCV Ab: NONREACTIVE

## 2022-05-05 LAB — PSA: Prostate Specific Ag, Serum: 0.6 ng/mL (ref 0.0–4.0)

## 2022-05-05 LAB — CARDIOVASCULAR RISK ASSESSMENT

## 2022-05-05 LAB — TSH: TSH: 2.47 u[IU]/mL (ref 0.450–4.500)

## 2022-05-09 ENCOUNTER — Other Ambulatory Visit: Payer: Self-pay

## 2022-05-09 ENCOUNTER — Telehealth: Payer: Self-pay

## 2022-05-09 MED ORDER — EZETIMIBE 10 MG PO TABS: 10.0000 mg | ORAL_TABLET | Freq: Every day | ORAL | 2 refills | Status: DC

## 2022-05-09 NOTE — Telephone Encounter (Signed)
Patient called inquiring about his lab results and asked if we had done a Testosterone level. I informed patient that I did not see one on there, and didn't know if we only check it but every so often but patient wanted me to send a message to ask if he was supposed to get one or does patient need to come back for a nurse visit to have that Drawn? Please advise.

## 2022-05-10 ENCOUNTER — Other Ambulatory Visit: Payer: Self-pay | Admitting: Family Medicine

## 2022-06-30 DIAGNOSIS — E230 Hypopituitarism: Secondary | ICD-10-CM | POA: Diagnosis not present

## 2022-06-30 DIAGNOSIS — E291 Testicular hypofunction: Secondary | ICD-10-CM | POA: Diagnosis not present

## 2022-06-30 DIAGNOSIS — Z125 Encounter for screening for malignant neoplasm of prostate: Secondary | ICD-10-CM | POA: Diagnosis not present

## 2022-07-11 DIAGNOSIS — E230 Hypopituitarism: Secondary | ICD-10-CM | POA: Diagnosis not present

## 2022-07-11 DIAGNOSIS — Z125 Encounter for screening for malignant neoplasm of prostate: Secondary | ICD-10-CM | POA: Diagnosis not present

## 2022-07-11 DIAGNOSIS — R0683 Snoring: Secondary | ICD-10-CM | POA: Diagnosis not present

## 2022-07-11 DIAGNOSIS — D751 Secondary polycythemia: Secondary | ICD-10-CM | POA: Diagnosis not present

## 2022-07-12 ENCOUNTER — Other Ambulatory Visit: Payer: Self-pay | Admitting: Family Medicine

## 2022-11-02 DIAGNOSIS — R7303 Prediabetes: Secondary | ICD-10-CM | POA: Insufficient documentation

## 2022-11-02 NOTE — Assessment & Plan Note (Signed)
Well controlled.  ?No changes to medicines.  ?Continue to work on eating a healthy diet and exercise.  ?Labs drawn today.  ?

## 2022-11-02 NOTE — Progress Notes (Unsigned)
Subjective:  Patient ID: James Craig, male    DOB: 1955-11-07  Age: 67 y.o. MRN: [id number]  Chief Complaint  Patient presents with   Hyperlipidemia   Hypogonadism    HPI   High Cholesterol: Zetia 10 mg daily.  Insomnia: Trazodone 50 mg daily taking PRN.  Hypogonadism: Using testosterone 200 mg 1.0 ml  every 14 days.  Chronic back pain: Currently on Meloxicam 15 mg daily, flexeril 5 mg daily.     05/04/2022   10:34 AM 02/16/2021    8:21 AM 01/06/2021   11:03 AM 04/15/2020   10:11 AM  Depression screen PHQ 2/9  Decreased Interest 0 0 0 0  Down, Depressed, Hopeless 0 0 0 0  PHQ - 2 Score 0 0 0 0  Altered sleeping 2     Tired, decreased energy 2     Change in appetite 0     Feeling bad or failure about yourself  0     Trouble concentrating 0     Moving slowly or fidgety/restless 0     Suicidal thoughts 0     PHQ-9 Score 4     Difficult doing work/chores Somewhat difficult            01/06/2021   11:02 AM 02/16/2021    8:21 AM 05/04/2022   10:33 AM  Fall Risk  Falls in the past year? 0 0 0  Was there an injury with Fall? 0 0 0  Fall Risk Category Calculator 0 0 0  Fall Risk Category (Retired) Low Low Low  (RETIRED) Patient Fall Risk Level Low fall risk Low fall risk Low fall risk  Patient at Risk for Falls Due to No Fall Risks No Fall Risks   Fall risk Follow up Falls evaluation completed Falls evaluation completed       Review of Systems  Constitutional:  Negative for chills, diaphoresis, fatigue and fever.  HENT:  Negative for congestion, ear pain and sore throat.   Respiratory:  Negative for cough and shortness of breath.   Cardiovascular:  Negative for chest pain and leg swelling.  Gastrointestinal:  Negative for abdominal pain, constipation, diarrhea, nausea and vomiting.  Genitourinary:  Positive for flank pain. Negative for dysuria and urgency.  Musculoskeletal:  Negative for arthralgias and myalgias.  Neurological:  Negative for dizziness and  headaches.  Psychiatric/Behavioral:  Negative for dysphoric mood.     Current Outpatient Medications on File Prior to Visit  Medication Sig Dispense Refill   cyclobenzaprine (FLEXERIL) 5 MG tablet Take 1 tablet (5 mg total) by mouth 3 (three) times daily as needed for muscle spasms. 90 tablet 0   ezetimibe (ZETIA) 10 MG tablet Take 1 tablet (10 mg total) by mouth daily. 30 tablet 2   fluticasone (FLONASE) 50 MCG/ACT nasal spray Place 2 sprays into both nostrils daily. 16 g 6   indomethacin (INDOCIN) 50 MG capsule Take 1 capsule (50 mg total) by mouth 2 (two) times daily with a meal. 60 capsule 0   meloxicam (MOBIC) 15 MG tablet Take 1 tablet (15 mg total) by mouth daily. 30 tablet 0   testosterone cypionate (DEPOTESTOSTERONE CYPIONATE) 200 MG/ML injection INJECT 1.5 ML INTRAMUSCULARLY EVERY 14 DAYS 10 mL 1   traZODone (DESYREL) 50 MG tablet Take 0.5-1 tablets (25-50 mg total) by mouth at bedtime as needed for sleep. 30 tablet 3   No current facility-administered medications on file prior to visit.   Past Medical History:  Diagnosis Date   Hyperlipidemia  Past Surgical History:  Procedure Laterality Date   lumbar back pain  04/10/2022   removed synovial cyst and treated spinal stenosis.    Family History  Family history unknown: Yes   Social History   Socioeconomic History   Marital status: Married    Spouse name: Not on file   Number of children: Not on file   Years of education: Not on file   Highest education level: Associate degree: occupational, Scientist, product/process development, or vocational program  Occupational History    Employer: [employer]  Tobacco Use   Smoking status: Never   Smokeless tobacco: Never  Vaping Use   Vaping Use: Never used  Substance and Sexual Activity   Alcohol use: Yes    Alcohol/week: 1.0 standard drink of alcohol    Types: 1 Glasses of wine per week    Comment: socially   Drug use: Never   Sexual activity: Not Currently    Partners: Female  Other  Topics Concern   Not on file  Social History Narrative   Not on file   Social Determinants of Health   Financial Resource Strain: Low Risk  (10/30/2022)   Overall Financial Resource Strain (CARDIA)    Difficulty of Paying Living Expenses: Not hard at all  Food Insecurity: No Food Insecurity (10/30/2022)   Hunger Vital Sign    Worried About Running Out of Food in the Last Year: Never true    Ran Out of Food in the Last Year: Never true  Transportation Needs: No Transportation Needs (10/30/2022)   PRAPARE - Administrator, Civil Service (Medical): No    Lack of Transportation (Non-Medical): No  Physical Activity: Insufficiently Active (10/30/2022)   Exercise Vital Sign    Days of Exercise per Week: 2 days    Minutes of Exercise per Session: 30 min  Stress: No Stress Concern Present (10/30/2022)   [employer] of Occupational Health - Occupational Stress Questionnaire    Feeling of Stress : Only a little  Social Connections: Moderately Integrated (10/30/2022)   Social Connection and Isolation Panel [NHANES]    Frequency of Communication with Friends and Family: Once a week    Frequency of Social Gatherings with Friends and Family: Once a week    Attends Religious Services: More than 4 times per year    Active Member of [employer] or Organizations: Yes    Attends Engineer, structural: More than 4 times per year    Marital Status: Married    Objective:  BP 110/70   Pulse 66   Temp (!) 97.3 F (36.3 C)   Resp 12   Ht 6\' 2"  (1.88 m)   Wt 253 lb (114.8 kg)   SpO2 98%   BMI 32.48 kg/m      11/03/2022    8:01 AM 05/04/2022   11:00 AM 05/04/2022   10:27 AM  BP/Weight  Systolic BP 110 130 140  Diastolic BP 70 90 80  Wt. (Lbs) 253  252.6  BMI 32.48 kg/m2  32.43 kg/m2    Physical Exam Vitals reviewed.  Constitutional:      Appearance: Normal appearance. He is normal weight.  Cardiovascular:     Rate and Rhythm: Normal rate and regular rhythm.     Heart  sounds: No murmur heard. Pulmonary:     Effort: Pulmonary effort is normal.     Breath sounds: Normal breath sounds.  Abdominal:     General: Abdomen is flat. Bowel sounds are normal.  Palpations: Abdomen is soft.     Tenderness: There is no abdominal tenderness.  Neurological:     Mental Status: He is alert and oriented to person, place, and time.  Psychiatric:        Mood and Affect: Mood normal.        Behavior: Behavior normal.     Diabetic Foot Exam - Simple   No data filed      Lab Results  Component Value Date   WBC 8.0 05/04/2022   HGB 18.1 (H) 05/04/2022   HCT 54.8 (H) 05/04/2022   PLT 262 05/04/2022   GLUCOSE 107 (H) 05/04/2022   CHOL 194 05/04/2022   TRIG 115 05/04/2022   HDL 45 05/04/2022   LDLCALC 128 (H) 05/04/2022   ALT 27 05/04/2022   AST 23 05/04/2022   NA 137 05/04/2022   K 4.5 05/04/2022   CL 101 05/04/2022   CREATININE 0.95 05/04/2022   BUN 9 05/04/2022   CO2 23 05/04/2022   TSH 2.470 05/04/2022   HGBA1C 5.8 (H) 05/04/2022      Assessment & Plan:    Testicular hypofunction Assessment & Plan: Checking testosterone levels.    Orders: -     Testosterone,Free and Total -     PSA  Mixed hyperlipidemia Assessment & Plan: Start on zetia 10 mg daily.  Continue to work on eating a healthy diet and exercise.  Labs drawn today.   Orders: -     Lipid panel  Prediabetes Assessment & Plan: Well controlled.  No changes to medicines.  Continue to work on eating a healthy diet and exercise.  Labs drawn today.    Orders: -     CBC with Differential/Platelet -     Comprehensive metabolic panel -     Hemoglobin A1c  Other orders -     Tadalafil; One tablet one hour prior to intercourse over a 48 hour period.  Dispense: 20 tablet; Refill: 3     Meds ordered this encounter  Medications   tadalafil (CIALIS) 20 MG tablet    Sig: One tablet one hour prior to intercourse over a 48 hour period.    Dispense:  20 tablet    Refill:  3     Orders Placed This Encounter  Procedures   CBC with Differential/Platelet   Comprehensive metabolic panel   Lipid panel   Hemoglobin A1c   Testosterone,Free and Total   PSA     Follow-up: Return in about 6 months (around 05/05/2023) for chronic fasting.   I,Katherina A Bramblett,acting as a scribe for Blane Ohara, MD.,have documented all relevant documentation on the behalf of Blane Ohara, MD,as directed by  Blane Ohara, MD while in the presence of Blane Ohara, MD.   An After Visit Summary was printed and given to the patient.  I attest that I have reviewed this visit and agree with the plan scribed by my staff.   Blane Ohara, MD [facility] [phone]

## 2022-11-02 NOTE — Assessment & Plan Note (Signed)
Start on zetia 10 mg daily.  Continue to work on eating a healthy diet and exercise.  Labs drawn today.

## 2022-11-02 NOTE — Assessment & Plan Note (Signed)
Checking testosterone levels.

## 2022-11-03 ENCOUNTER — Ambulatory Visit (INDEPENDENT_AMBULATORY_CARE_PROVIDER_SITE_OTHER): Payer: BC Managed Care – PPO | Admitting: Family Medicine

## 2022-11-03 ENCOUNTER — Encounter: Payer: Self-pay | Admitting: Family Medicine

## 2022-11-03 VITALS — BP 110/70 | HR 66 | Temp 97.3°F | Resp 12 | Ht 74.0 in | Wt 253.0 lb

## 2022-11-03 DIAGNOSIS — R7303 Prediabetes: Secondary | ICD-10-CM

## 2022-11-03 DIAGNOSIS — G4709 Other insomnia: Secondary | ICD-10-CM

## 2022-11-03 DIAGNOSIS — E291 Testicular hypofunction: Secondary | ICD-10-CM

## 2022-11-03 DIAGNOSIS — E782 Mixed hyperlipidemia: Secondary | ICD-10-CM

## 2022-11-03 MED ORDER — TADALAFIL 20 MG PO TABS: ORAL_TABLET | ORAL | 3 refills | Status: DC

## 2022-11-08 LAB — CBC WITH DIFFERENTIAL/PLATELET
Basophils Absolute: 0.1 10*3/uL (ref 0.0–0.2)
Basos: 1 %
EOS (ABSOLUTE): 0.1 10*3/uL (ref 0.0–0.4)
Eos: 1 %
Hematocrit: 52 % — ABNORMAL HIGH (ref 37.5–51.0)
Hemoglobin: 17.5 g/dL (ref 13.0–17.7)
Immature Grans (Abs): 0 10*3/uL (ref 0.0–0.1)
Immature Granulocytes: 0 %
Lymphocytes Absolute: 1.7 10*3/uL (ref 0.7–3.1)
Lymphs: 33 %
MCH: 31.1 pg (ref 26.6–33.0)
MCHC: 33.7 g/dL (ref 31.5–35.7)
MCV: 93 fL (ref 79–97)
Monocytes Absolute: 0.4 10*3/uL (ref 0.1–0.9)
Monocytes: 8 %
Neutrophils Absolute: 2.9 10*3/uL (ref 1.4–7.0)
Neutrophils: 57 %
Platelets: 229 10*3/uL (ref 150–450)
RBC: 5.62 x10E6/uL (ref 4.14–5.80)
RDW: 12.1 % (ref 11.6–15.4)
WBC: 5.1 10*3/uL (ref 3.4–10.8)

## 2022-11-08 LAB — LIPID PANEL
Chol/HDL Ratio: 3.5 ratio (ref 0.0–5.0)
Cholesterol, Total: 170 mg/dL (ref 100–199)
HDL: 48 mg/dL (ref >39)
LDL Chol Calc (NIH): 110 mg/dL — ABNORMAL HIGH (ref 0–99)
Triglycerides: 59 mg/dL (ref 0–149)
VLDL Cholesterol Cal: 12 mg/dL (ref 5–40)

## 2022-11-08 LAB — COMPREHENSIVE METABOLIC PANEL WITH GFR
ALT: 31 IU/L (ref 0–44)
AST: 26 IU/L (ref 0–40)
Albumin/Globulin Ratio: 2 (ref 1.2–2.2)
Albumin: 4.5 g/dL (ref 3.9–4.9)
Alkaline Phosphatase: 65 IU/L (ref 44–121)
BUN/Creatinine Ratio: 15 (ref 10–24)
BUN: 15 mg/dL (ref 8–27)
Bilirubin Total: 0.5 mg/dL (ref 0.0–1.2)
CO2: 21 mmol/L (ref 20–29)
Calcium: 9.1 mg/dL (ref 8.6–10.2)
Chloride: 100 mmol/L (ref 96–106)
Creatinine, Ser: 1.01 mg/dL (ref 0.76–1.27)
Globulin, Total: 2.3 g/dL (ref 1.5–4.5)
Glucose: 111 mg/dL — ABNORMAL HIGH (ref 70–99)
Potassium: 4.7 mmol/L (ref 3.5–5.2)
Sodium: 138 mmol/L (ref 134–144)
Total Protein: 6.8 g/dL (ref 6.0–8.5)
eGFR: 83 mL/min/1.73 (ref >59)

## 2022-11-08 LAB — HEMOGLOBIN A1C
Est. average glucose Bld gHb Est-mCnc: 126 mg/dL
Hgb A1c MFr Bld: 6 % — ABNORMAL HIGH (ref 4.8–5.6)

## 2022-11-08 LAB — TESTOSTERONE,FREE AND TOTAL
Testosterone, Free: 26.9 pg/mL — ABNORMAL HIGH (ref 6.6–18.1)
Testosterone: 1045 ng/dL — ABNORMAL HIGH (ref 264–916)

## 2022-11-08 LAB — PSA: Prostate Specific Ag, Serum: 1 ng/mL (ref 0.0–4.0)

## 2022-11-08 LAB — CARDIOVASCULAR RISK ASSESSMENT

## 2022-11-21 DIAGNOSIS — D692 Other nonthrombocytopenic purpura: Secondary | ICD-10-CM | POA: Diagnosis not present

## 2022-11-21 DIAGNOSIS — D2239 Melanocytic nevi of other parts of face: Secondary | ICD-10-CM | POA: Diagnosis not present

## 2022-11-21 DIAGNOSIS — C44319 Basal cell carcinoma of skin of other parts of face: Secondary | ICD-10-CM | POA: Diagnosis not present

## 2022-11-21 DIAGNOSIS — L814 Other melanin hyperpigmentation: Secondary | ICD-10-CM | POA: Diagnosis not present

## 2022-11-21 DIAGNOSIS — C4441 Basal cell carcinoma of skin of scalp and neck: Secondary | ICD-10-CM | POA: Diagnosis not present

## 2022-11-21 DIAGNOSIS — L570 Actinic keratosis: Secondary | ICD-10-CM | POA: Diagnosis not present

## 2022-11-21 DIAGNOSIS — D225 Melanocytic nevi of trunk: Secondary | ICD-10-CM | POA: Diagnosis not present

## 2022-11-21 DIAGNOSIS — Z85828 Personal history of other malignant neoplasm of skin: Secondary | ICD-10-CM | POA: Diagnosis not present

## 2023-01-03 DIAGNOSIS — C44319 Basal cell carcinoma of skin of other parts of face: Secondary | ICD-10-CM | POA: Diagnosis not present

## 2023-02-05 ENCOUNTER — Other Ambulatory Visit: Payer: Self-pay | Admitting: Family Medicine

## 2023-02-27 ENCOUNTER — Other Ambulatory Visit: Payer: Self-pay | Admitting: Student

## 2023-02-27 DIAGNOSIS — Z6832 Body mass index (BMI) 32.0-32.9, adult: Secondary | ICD-10-CM | POA: Diagnosis not present

## 2023-02-27 DIAGNOSIS — M5416 Radiculopathy, lumbar region: Secondary | ICD-10-CM

## 2023-03-01 ENCOUNTER — Other Ambulatory Visit: Payer: Self-pay | Admitting: Student

## 2023-03-01 ENCOUNTER — Encounter: Payer: Self-pay | Admitting: Student

## 2023-03-01 DIAGNOSIS — M5416 Radiculopathy, lumbar region: Secondary | ICD-10-CM

## 2023-03-06 ENCOUNTER — Ambulatory Visit: Payer: BC Managed Care – PPO

## 2023-03-06 DIAGNOSIS — M545 Low back pain, unspecified: Secondary | ICD-10-CM | POA: Diagnosis not present

## 2023-03-06 DIAGNOSIS — M5136 Other intervertebral disc degeneration, lumbar region: Secondary | ICD-10-CM | POA: Diagnosis not present

## 2023-03-06 DIAGNOSIS — M47816 Spondylosis without myelopathy or radiculopathy, lumbar region: Secondary | ICD-10-CM | POA: Diagnosis not present

## 2023-03-06 DIAGNOSIS — M5126 Other intervertebral disc displacement, lumbar region: Secondary | ICD-10-CM | POA: Diagnosis not present

## 2023-03-06 DIAGNOSIS — M5416 Radiculopathy, lumbar region: Secondary | ICD-10-CM

## 2023-03-06 DIAGNOSIS — M25551 Pain in right hip: Secondary | ICD-10-CM

## 2023-03-06 DIAGNOSIS — M5137 Other intervertebral disc degeneration, lumbosacral region: Secondary | ICD-10-CM | POA: Diagnosis not present

## 2023-03-06 MED ORDER — GADOBUTROL 1 MMOL/ML IV SOLN
10.0000 mL | Freq: Once | INTRAVENOUS | Status: AC | PRN
Start: 2023-03-06 — End: 2023-03-06
  Administered 2023-03-06: 10 mL via INTRAVENOUS

## 2023-03-13 IMAGING — CR DG LUMBAR SPINE COMPLETE 4+V
5 series · 5 of 5 positions shown · non-contrast
Comparison: None Available.

CLINICAL DATA: Lower back pain.

EXAM:
LUMBAR SPINE - COMPLETE 4+ VIEW

[w lumbar spine ap]
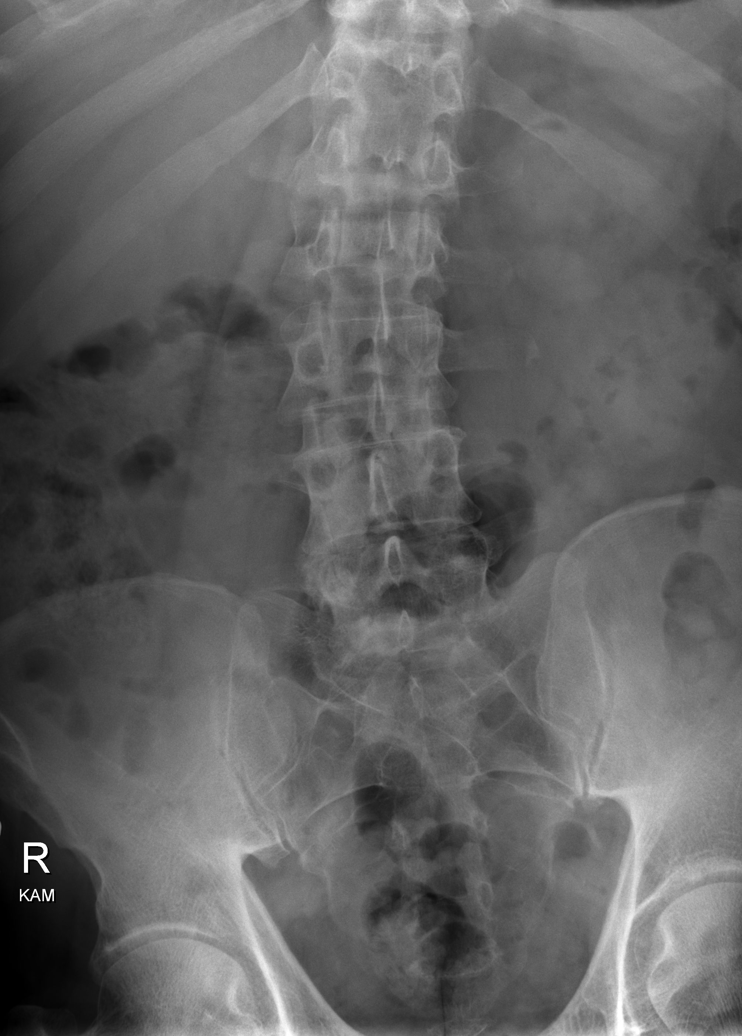

[w lumbar spine obl (1 of 2)]
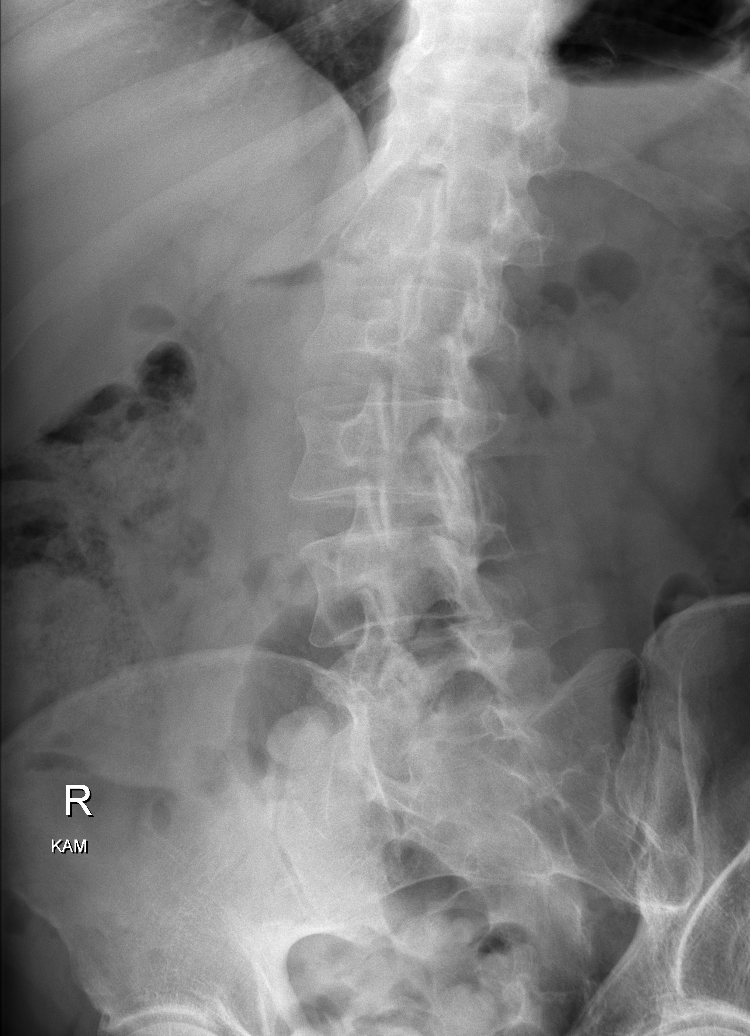

[w lumbar spine obl (2 of 2)]
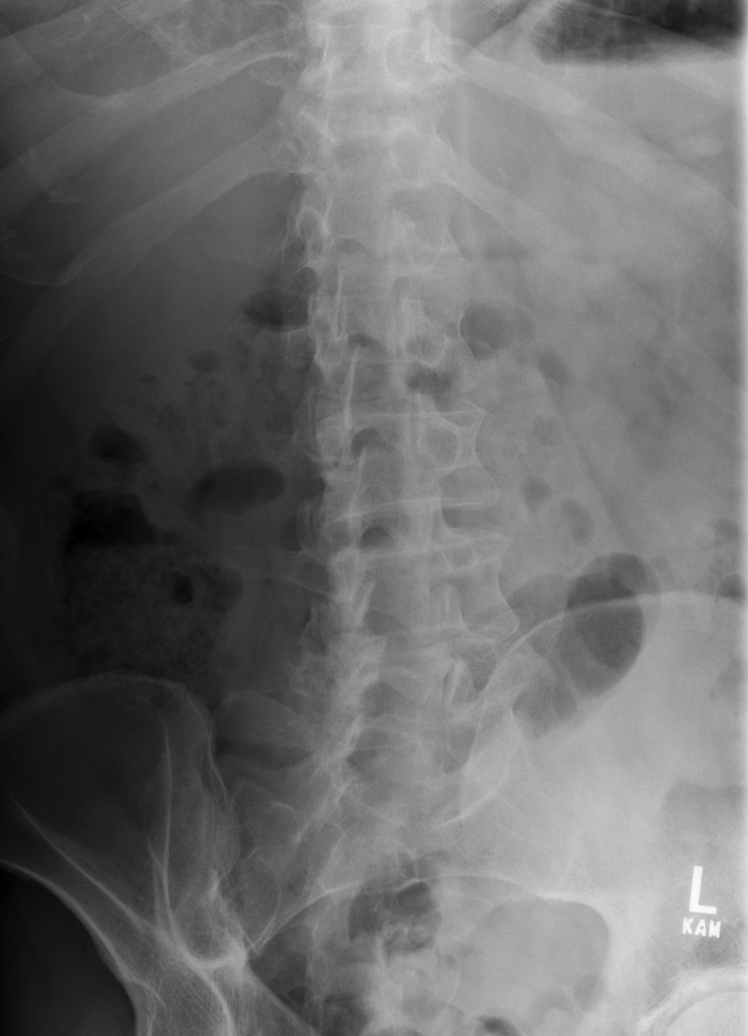

[w lumbar spine lat]
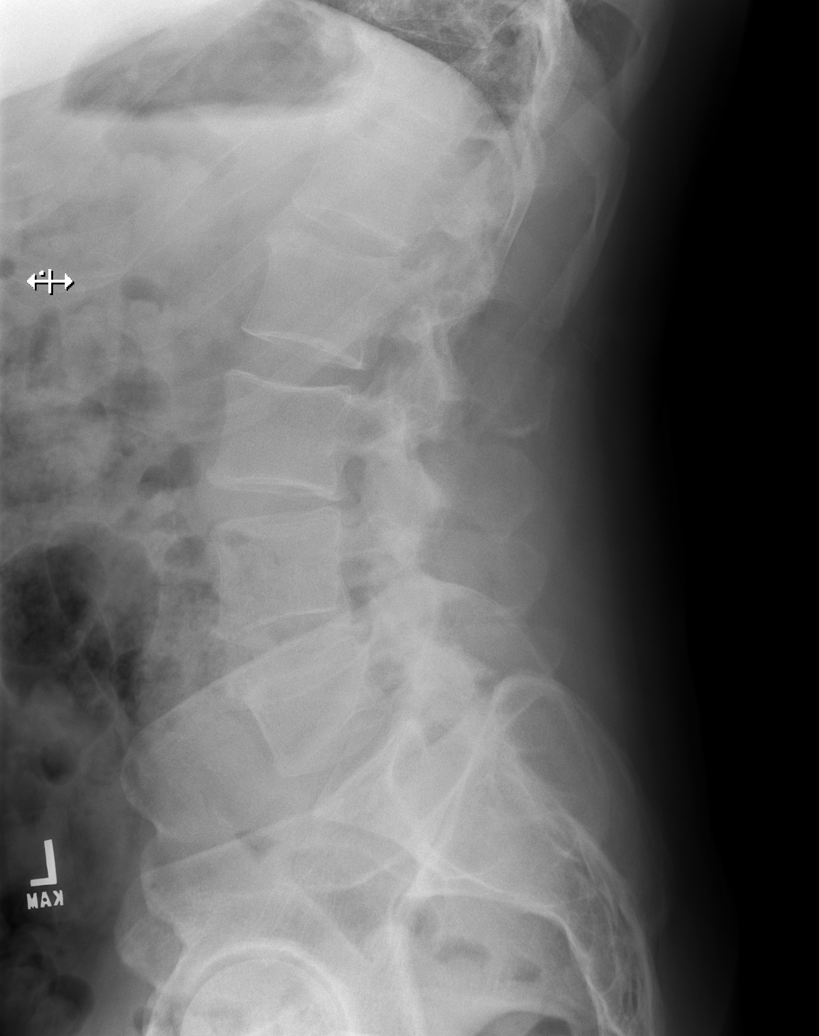

[w lumbar l-5 s-1 spot]
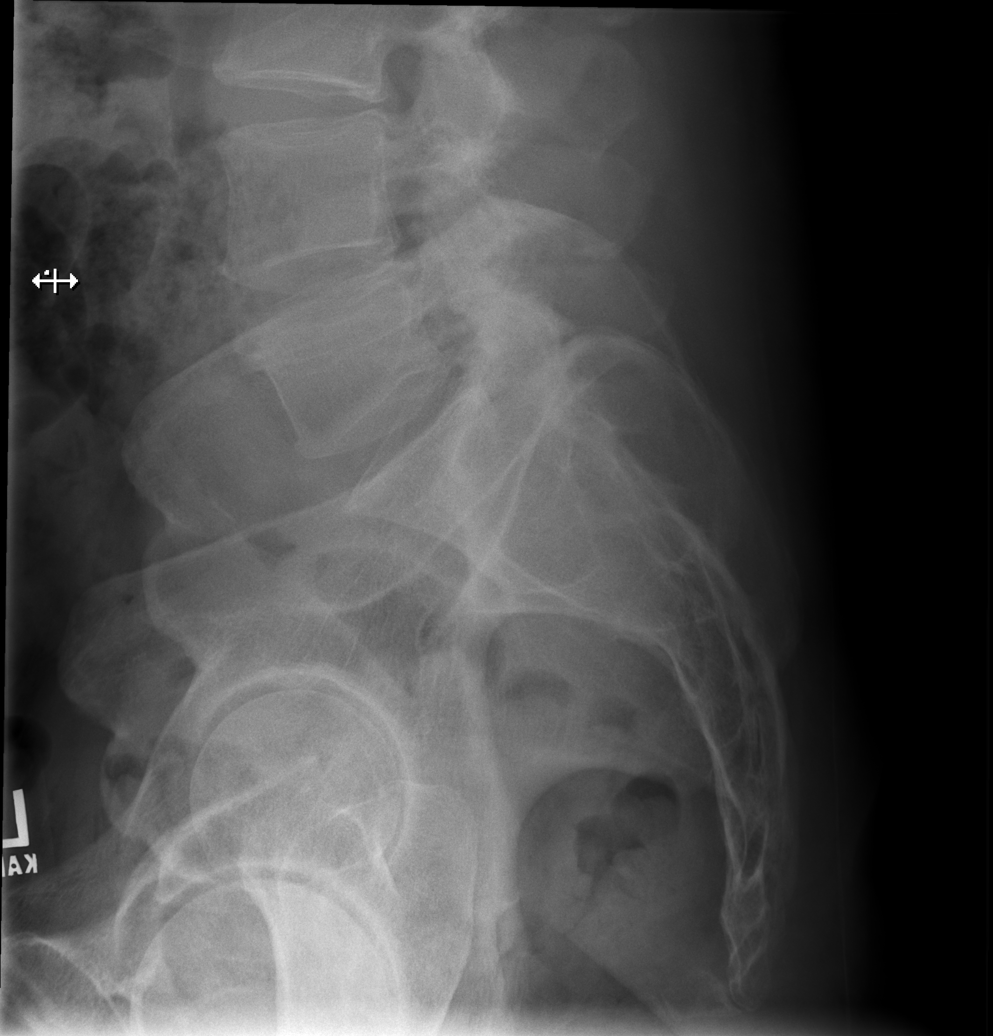

[5 of 5 positions shown; findings below may reference images not displayed]

FINDINGS: Normal anatomic alignment. L3-4 and L4-5 degenerative disc disease.
Multilevel lower lumbar spine facet degenerative changes.
Age-indeterminate T12 vertebral body height loss. SI joints are
unremarkable.
IMPRESSION: Lower lumbar spine degenerative disc and facet disease.

Age-indeterminate height loss of the T12 vertebral body, correlate
for point tenderness.

## 2023-03-20 DIAGNOSIS — M5416 Radiculopathy, lumbar region: Secondary | ICD-10-CM | POA: Diagnosis not present

## 2023-03-20 DIAGNOSIS — Z6832 Body mass index (BMI) 32.0-32.9, adult: Secondary | ICD-10-CM | POA: Diagnosis not present

## 2023-03-26 DIAGNOSIS — M47816 Spondylosis without myelopathy or radiculopathy, lumbar region: Secondary | ICD-10-CM | POA: Diagnosis not present

## 2023-03-27 DIAGNOSIS — M5416 Radiculopathy, lumbar region: Secondary | ICD-10-CM | POA: Diagnosis not present

## 2023-03-27 DIAGNOSIS — R531 Weakness: Secondary | ICD-10-CM | POA: Diagnosis not present

## 2023-03-29 DIAGNOSIS — R531 Weakness: Secondary | ICD-10-CM | POA: Diagnosis not present

## 2023-03-29 DIAGNOSIS — R262 Difficulty in walking, not elsewhere classified: Secondary | ICD-10-CM | POA: Diagnosis not present

## 2023-03-29 DIAGNOSIS — M5416 Radiculopathy, lumbar region: Secondary | ICD-10-CM | POA: Diagnosis not present

## 2023-04-06 DIAGNOSIS — R531 Weakness: Secondary | ICD-10-CM | POA: Diagnosis not present

## 2023-04-06 DIAGNOSIS — R262 Difficulty in walking, not elsewhere classified: Secondary | ICD-10-CM | POA: Diagnosis not present

## 2023-04-06 DIAGNOSIS — M5416 Radiculopathy, lumbar region: Secondary | ICD-10-CM | POA: Diagnosis not present

## 2023-04-08 ENCOUNTER — Other Ambulatory Visit: Payer: BC Managed Care – PPO

## 2023-04-09 DIAGNOSIS — R531 Weakness: Secondary | ICD-10-CM | POA: Diagnosis not present

## 2023-04-09 DIAGNOSIS — R262 Difficulty in walking, not elsewhere classified: Secondary | ICD-10-CM | POA: Diagnosis not present

## 2023-04-09 DIAGNOSIS — M5416 Radiculopathy, lumbar region: Secondary | ICD-10-CM | POA: Diagnosis not present

## 2023-04-10 DIAGNOSIS — R262 Difficulty in walking, not elsewhere classified: Secondary | ICD-10-CM | POA: Diagnosis not present

## 2023-04-10 DIAGNOSIS — R531 Weakness: Secondary | ICD-10-CM | POA: Diagnosis not present

## 2023-04-10 DIAGNOSIS — M5416 Radiculopathy, lumbar region: Secondary | ICD-10-CM | POA: Diagnosis not present

## 2023-04-17 DIAGNOSIS — M47816 Spondylosis without myelopathy or radiculopathy, lumbar region: Secondary | ICD-10-CM | POA: Diagnosis not present

## 2023-04-17 DIAGNOSIS — Z6832 Body mass index (BMI) 32.0-32.9, adult: Secondary | ICD-10-CM | POA: Diagnosis not present

## 2023-04-24 DIAGNOSIS — M47816 Spondylosis without myelopathy or radiculopathy, lumbar region: Secondary | ICD-10-CM | POA: Diagnosis not present

## 2023-04-27 ENCOUNTER — Encounter: Payer: Self-pay | Admitting: Physician Assistant

## 2023-04-27 ENCOUNTER — Ambulatory Visit (INDEPENDENT_AMBULATORY_CARE_PROVIDER_SITE_OTHER): Payer: BC Managed Care – PPO | Admitting: Physician Assistant

## 2023-04-27 VITALS — BP 140/98 | HR 64 | Temp 97.4°F | Resp 14 | Ht 74.0 in | Wt 250.0 lb

## 2023-04-27 DIAGNOSIS — Z23 Encounter for immunization: Secondary | ICD-10-CM | POA: Diagnosis not present

## 2023-04-27 DIAGNOSIS — E782 Mixed hyperlipidemia: Secondary | ICD-10-CM

## 2023-04-27 DIAGNOSIS — R030 Elevated blood-pressure reading, without diagnosis of hypertension: Secondary | ICD-10-CM | POA: Insufficient documentation

## 2023-04-27 DIAGNOSIS — R7303 Prediabetes: Secondary | ICD-10-CM

## 2023-04-27 DIAGNOSIS — E291 Testicular hypofunction: Secondary | ICD-10-CM

## 2023-04-27 DIAGNOSIS — Z125 Encounter for screening for malignant neoplasm of prostate: Secondary | ICD-10-CM | POA: Diagnosis not present

## 2023-04-27 DIAGNOSIS — K429 Umbilical hernia without obstruction or gangrene: Secondary | ICD-10-CM

## 2023-04-27 MED ORDER — TADALAFIL 5 MG PO TABS
5.0000 mg | ORAL_TABLET | Freq: Every day | ORAL | 11 refills | Status: DC
Start: 2023-04-27 — End: 2024-05-13

## 2023-04-27 NOTE — Assessment & Plan Note (Signed)
Referral sent to Dr.Moorehead for evaluation and possible repair Will continue to monitor.

## 2023-04-27 NOTE — Assessment & Plan Note (Signed)
Well controlled.  Continue to work on eating a healthy diet and exercise.  Labs drawn today.   No major side effects reported, and no issues with compliance. The current medical regimen is effective;  continue present plan with Zetia 10mg  Will adjust medication as needed depending on labs Lab Results  Component Value Date   LDLCALC 110 (H) 11/03/2022

## 2023-04-27 NOTE — Assessment & Plan Note (Signed)
Controlled Denies any major side effects or issues with the medication.  Continue using testosterone 200mg  Labs drawn today Will adjust treatment as needed.

## 2023-04-27 NOTE — Progress Notes (Signed)
Subjective:  Patient ID: James Craig, male    DOB: [birth year]  Age: 67 y.o. MRN: [id number]  Chief Complaint  Patient presents with   Medical Management of Chronic Issues    HPI   Hypertension: Admits to having increased BP at recent Dr visits. Denies any HA, dizziness, SOB, chest pain, abnormal vision. Will continue to monitor  High Cholesterol: Zetia 10 mg daily.  Insomnia: Trazodone 50 mg daily taking PRN.  Hypogonadism: Using testosterone 200 mg 1.0 ml  every 14 days.  Chronic back pain: Currently on Meloxicam 15 mg daily, flexeril 5 mg daily. Recently had steroid injections into his back. States they may consider oblations of the nerve. Has been doing some PT and exercises to help with the pain and it has improved slowly. Discussed letting pain help with his decision and if he is slowly doing better with PT to continue PT and if it gets worse or does not get better then maybe consider surgery.   Umbilical Hernia: Patient denies any major abdominal pain, fever, change in color, nausea, vomiting. States he has noticed it has grown over the past few months. Was wanting to have it looked at and possibly repaired.      04/27/2023    9:34 AM 05/04/2022   10:34 AM 02/16/2021    8:21 AM 01/06/2021   11:03 AM 04/15/2020   10:11 AM  Depression screen PHQ 2/9  Decreased Interest 0 0 0 0 0  Down, Depressed, Hopeless 0 0 0 0 0  PHQ - 2 Score 0 0 0 0 0  Altered sleeping 1 2     Tired, decreased energy 0 2     Change in appetite 0 0     Feeling bad or failure about yourself  0 0     Trouble concentrating 0 0     Moving slowly or fidgety/restless 0 0     Suicidal thoughts 0 0     PHQ-9 Score 1 4     Difficult doing work/chores Not difficult at all Somewhat difficult           04/27/2023    9:33 AM  Fall Risk   Falls in the past year? 0  Number falls in past yr: 0  Injury with Fall? 0  Risk for fall due to : No Fall Risks  Follow up Falls prevention discussed    Patient  Care Team: Blane Ohara, MD as PCP - General (Family Medicine)   Review of Systems  Constitutional:  Negative for chills, fatigue, fever and unexpected weight change.  HENT:  Positive for sinus pressure and sinus pain. Negative for congestion, ear pain and sore throat.   Cardiovascular:  Negative for chest pain and palpitations.  Gastrointestinal:  Negative for abdominal pain, blood in stool, constipation, diarrhea, nausea and vomiting.  Endocrine: Negative for polydipsia.  Genitourinary:  Negative for dysuria.  Musculoskeletal:  Positive for back pain.  Skin:  Negative for rash.  Neurological:  Negative for headaches.    Current Outpatient Medications on File Prior to Visit  Medication Sig Dispense Refill   cyclobenzaprine (FLEXERIL) 5 MG tablet Take 1 tablet (5 mg total) by mouth 3 (three) times daily as needed for muscle spasms. 90 tablet 0   ezetimibe (ZETIA) 10 MG tablet Take 1 tablet (10 mg total) by mouth daily. 30 tablet 2   fluticasone (FLONASE) 50 MCG/ACT nasal spray Place 2 sprays into both nostrils daily. 16 g 6   indomethacin (INDOCIN) 50 MG  capsule Take 1 capsule (50 mg total) by mouth 2 (two) times daily with a meal. 60 capsule 0   meloxicam (MOBIC) 15 MG tablet Take 1 tablet (15 mg total) by mouth daily. 30 tablet 0   testosterone cypionate (DEPOTESTOSTERONE CYPIONATE) 200 MG/ML injection INJECT 1.5 ML INTRAMUSCULARLY EVERY 14 DAYS 10 mL 1   traZODone (DESYREL) 50 MG tablet Take 0.5-1 tablets (25-50 mg total) by mouth at bedtime as needed for sleep. 30 tablet 3   No current facility-administered medications on file prior to visit.   Past Medical History:  Diagnosis Date   Hyperlipidemia    Past Surgical History:  Procedure Laterality Date   lumbar back pain  04/10/2022   removed synovial cyst and treated spinal stenosis.    Family History  Family history unknown: Yes   Social History   Socioeconomic History   Marital status: Married    Spouse name: Not on  file   Number of children: Not on file   Years of education: Not on file   Highest education level: Associate degree: occupational, Scientist, product/process development, or vocational program  Occupational History    Employer: [employer]  Tobacco Use   Smoking status: Never   Smokeless tobacco: Never  Vaping Use   Vaping status: Never Used  Substance and Sexual Activity   Alcohol use: Yes    Alcohol/week: 1.0 standard drink of alcohol    Types: 1 Glasses of wine per week    Comment: socially   Drug use: Never   Sexual activity: Not Currently    Partners: Female  Other Topics Concern   Not on file  Social History Narrative   Not on file   Social Determinants of Health   Financial Resource Strain: Low Risk  (10/30/2022)   Overall Financial Resource Strain (CARDIA)    Difficulty of Paying Living Expenses: Not hard at all  Food Insecurity: No Food Insecurity (10/30/2022)   Hunger Vital Sign    Worried About Running Out of Food in the Last Year: Never true    Ran Out of Food in the Last Year: Never true  Transportation Needs: No Transportation Needs (10/30/2022)   PRAPARE - Administrator, Civil Service (Medical): No    Lack of Transportation (Non-Medical): No  Physical Activity: Insufficiently Active (10/30/2022)   Exercise Vital Sign    Days of Exercise per Week: 2 days    Minutes of Exercise per Session: 30 min  Stress: No Stress Concern Present (10/30/2022)   [employer] of Occupational Health - Occupational Stress Questionnaire    Feeling of Stress : Only a little  Social Connections: Moderately Integrated (10/30/2022)   Social Connection and Isolation Panel [NHANES]    Frequency of Communication with Friends and Family: Once a week    Frequency of Social Gatherings with Friends and Family: Once a week    Attends Religious Services: More than 4 times per year    Active Member of [employer] or Organizations: Yes    Attends Engineer, structural: More than 4 times per year     Marital Status: Married    Objective:  BP (!) 140/98   Pulse 64   Temp (!) 97.4 F (36.3 C)   Resp 14   Ht 6\' 2"  (1.88 m)   Wt 250 lb (113.4 kg)   SpO2 97%   BMI 32.10 kg/m      04/27/2023    9:15 AM 11/03/2022    8:01 AM 05/04/2022  11:00 AM  BP/Weight  Systolic BP 140 110 130  Diastolic BP 98 70 90  Wt. (Lbs) 250 253   BMI 32.1 kg/m2 32.48 kg/m2     Physical Exam Vitals reviewed.  Constitutional:      Appearance: Normal appearance.  Neck:     Vascular: No carotid bruit.  Cardiovascular:     Rate and Rhythm: Normal rate and regular rhythm.     Heart sounds: Normal heart sounds.  Pulmonary:     Effort: Pulmonary effort is normal.     Breath sounds: Normal breath sounds.  Abdominal:     General: Bowel sounds are normal.     Palpations: Abdomen is soft.     Tenderness: There is no abdominal tenderness.     Hernia: A hernia is present. Hernia is present in the umbilical area.     Comments: No pain, discoloration. Has grown in size over the past few months.  Neurological:     Mental Status: He is alert and oriented to person, place, and time.  Psychiatric:        Mood and Affect: Mood normal.        Behavior: Behavior normal.     Diabetic Foot Exam - Simple   No data filed      Lab Results  Component Value Date   WBC 5.1 11/03/2022   HGB 17.5 11/03/2022   HCT 52.0 (H) 11/03/2022   PLT 229 11/03/2022   GLUCOSE 111 (H) 11/03/2022   CHOL 170 11/03/2022   TRIG 59 11/03/2022   HDL 48 11/03/2022   LDLCALC 110 (H) 11/03/2022   ALT 31 11/03/2022   AST 26 11/03/2022   NA 138 11/03/2022   K 4.7 11/03/2022   CL 100 11/03/2022   CREATININE 1.01 11/03/2022   BUN 15 11/03/2022   CO2 21 11/03/2022   TSH 2.470 05/04/2022   HGBA1C 6.0 (H) 11/03/2022      Assessment & Plan:    Mixed hyperlipidemia Assessment & Plan: Well controlled.  Continue to work on eating a healthy diet and exercise.  Labs drawn today.   No major side effects reported, and no  issues with compliance. The current medical regimen is effective;  continue present plan with Zetia 10mg  Will adjust medication as needed depending on labs Lab Results  Component Value Date   LDLCALC 110 (H) 11/03/2022     Orders: -     CBC with Differential/Platelet -     Comprehensive metabolic panel -     Lipid panel  Prediabetes Assessment & Plan: Controlled Continue to monitor diet and exercise Labs drawn today Will adjust treatment as needed    Orders: -     Hemoglobin A1c  Testicular hypofunction Assessment & Plan: Controlled Denies any major side effects or issues with the medication.  Continue using testosterone 200mg  Labs drawn today Will adjust treatment as needed.   Orders: -     Testosterone,Free and Total  Prostate cancer screening -     PSA -     Tadalafil; Take 1 tablet (5 mg total) by mouth daily.  Dispense: 30 tablet; Refill: 11  Umbilical hernia without obstruction and without gangrene Assessment & Plan: Referral sent to Dr.Moorehead for evaluation and possible repair Will continue to monitor.  Orders: -     Ambulatory referral to General Surgery  Elevated BP without diagnosis of hypertension Assessment & Plan: Admits to having recent readings of high BP at a few different offices Will continue to monitor  Meds ordered this encounter  Medications   tadalafil (CIALIS) 5 MG tablet    Sig: Take 1 tablet (5 mg total) by mouth daily.    Dispense:  30 tablet    Refill:  11    Orders Placed This Encounter  Procedures   CBC with Differential/Platelet   Comprehensive metabolic panel   Lipid panel   Testosterone,Free and Total   PSA   Hemoglobin A1c   Ambulatory referral to General Surgery     Follow-up: Return in about 6 months (around 10/26/2023) for Chronic, fasting, Huston Foley.   I,Marla I Leal-Borjas,acting as a scribe for [employer], PA.,have documented all relevant documentation on the behalf of Langley Gauss, PA,as directed  by  Langley Gauss, PA while in the presence of Langley Gauss, [place].   An After Visit Summary was printed and given to the patient.  Langley Gauss, [place] [facility] [phone]

## 2023-04-27 NOTE — Assessment & Plan Note (Signed)
Admits to having recent readings of high BP at a few different offices Will continue to monitor

## 2023-04-27 NOTE — Assessment & Plan Note (Signed)
Controlled Continue to monitor diet and exercise Labs drawn today Will adjust treatment as needed

## 2023-05-01 LAB — TESTOSTERONE,FREE AND TOTAL
Testosterone, Free: 8.6 pg/mL (ref 6.6–18.1)
Testosterone: 171 ng/dL — ABNORMAL LOW (ref 264–916)

## 2023-05-01 LAB — HEMOGLOBIN A1C
Est. average glucose Bld gHb Est-mCnc: 128 mg/dL
Hgb A1c MFr Bld: 6.1 % — ABNORMAL HIGH (ref 4.8–5.6)

## 2023-05-01 LAB — COMPREHENSIVE METABOLIC PANEL
ALT: 41 [IU]/L (ref 0–44)
AST: 34 [IU]/L (ref 0–40)
Albumin: 4.7 g/dL (ref 3.9–4.9)
Alkaline Phosphatase: 67 [IU]/L (ref 44–121)
BUN/Creatinine Ratio: 13 (ref 10–24)
BUN: 14 mg/dL (ref 8–27)
Bilirubin Total: 0.8 mg/dL (ref 0.0–1.2)
CO2: 23 mmol/L (ref 20–29)
Calcium: 9.8 mg/dL (ref 8.6–10.2)
Chloride: 98 mmol/L (ref 96–106)
Creatinine, Ser: 1.04 mg/dL (ref 0.76–1.27)
Globulin, Total: 2.7 g/dL (ref 1.5–4.5)
Glucose: 115 mg/dL — ABNORMAL HIGH (ref 70–99)
Potassium: 4.8 mmol/L (ref 3.5–5.2)
Sodium: 136 mmol/L (ref 134–144)
Total Protein: 7.4 g/dL (ref 6.0–8.5)
eGFR: 80 mL/min/{1.73_m2} (ref >59)

## 2023-05-01 LAB — CBC WITH DIFFERENTIAL/PLATELET
Basophils Absolute: 0.1 10*3/uL (ref 0.0–0.2)
Basos: 2 %
EOS (ABSOLUTE): 0.1 10*3/uL (ref 0.0–0.4)
Eos: 1 %
Hematocrit: 55.1 % — ABNORMAL HIGH (ref 37.5–51.0)
Hemoglobin: 18.9 g/dL — ABNORMAL HIGH (ref 13.0–17.7)
Immature Grans (Abs): 0 10*3/uL (ref 0.0–0.1)
Immature Granulocytes: 0 %
Lymphocytes Absolute: 1.5 10*3/uL (ref 0.7–3.1)
Lymphs: 29 %
MCH: 32.6 pg (ref 26.6–33.0)
MCHC: 34.3 g/dL (ref 31.5–35.7)
MCV: 95 fL (ref 79–97)
Monocytes Absolute: 0.5 10*3/uL (ref 0.1–0.9)
Monocytes: 10 %
Neutrophils Absolute: 3 10*3/uL (ref 1.4–7.0)
Neutrophils: 58 %
Platelets: 241 10*3/uL (ref 150–450)
RBC: 5.8 x10E6/uL (ref 4.14–5.80)
RDW: 12.5 % (ref 11.6–15.4)
WBC: 5.3 10*3/uL (ref 3.4–10.8)

## 2023-05-01 LAB — LIPID PANEL
Chol/HDL Ratio: 4.5 {ratio} (ref 0.0–5.0)
Cholesterol, Total: 245 mg/dL — ABNORMAL HIGH (ref 100–199)
HDL: 54 mg/dL (ref >39)
LDL Chol Calc (NIH): 170 mg/dL — ABNORMAL HIGH (ref 0–99)
Triglycerides: 116 mg/dL (ref 0–149)
VLDL Cholesterol Cal: 21 mg/dL (ref 5–40)

## 2023-05-01 LAB — PSA: Prostate Specific Ag, Serum: 0.6 ng/mL (ref 0.0–4.0)

## 2023-05-02 ENCOUNTER — Other Ambulatory Visit: Payer: Self-pay | Admitting: Family Medicine

## 2023-05-04 ENCOUNTER — Ambulatory Visit: Payer: BC Managed Care – PPO | Admitting: Family Medicine

## 2023-05-07 ENCOUNTER — Other Ambulatory Visit: Payer: Self-pay

## 2023-05-07 DIAGNOSIS — R7989 Other specified abnormal findings of blood chemistry: Secondary | ICD-10-CM

## 2023-05-16 DIAGNOSIS — K429 Umbilical hernia without obstruction or gangrene: Secondary | ICD-10-CM | POA: Diagnosis not present

## 2023-05-21 NOTE — Progress Notes (Unsigned)
[facility]  [address] [place],  [place]  [address] [phone]  Clinic Day:  05/22/2023  Referring physician: Blane Ohara, MD   HISTORY OF PRESENT ILLNESS:  The patient is a 67 y.o. male  who I was asked to consult upon for polycythemia.  An October 2024 CBC showed an elevated hemoglobin of 18.9.  Of note, this gentleman has been taking testosterone for the past 4 years.  His current dose is 1.5 mg every 2 weeks.  As he has had elevated hemoglobin levels in the past, he has been undergoing blood donations once every 8 weeks; he has been doing this for the past year.  The patient denies ever smoking.  He also denies having undergone a previous splenectomy.  He denies having previously lived in high altitudes.  He denies being on diuretics.  All these aforementioned factors, if present, can lead to pseudopolycythemia.  Overall, this patient denies having any particular changes in his health over this calendar year.  PAST MEDICAL HISTORY:   Past Medical History:  Diagnosis Date   Hyperlipidemia     PAST SURGICAL HISTORY:   Past Surgical History:  Procedure Laterality Date   ANAL CYST EXCISION     CATARACT EXTRACTION Bilateral    lumbar back pain  04/10/2022   removed synovial cyst and treated spinal stenosis.   VASECTOMY      CURRENT MEDICATIONS:   Current Outpatient Medications  Medication Sig Dispense Refill   cyclobenzaprine (FLEXERIL) 5 MG tablet Take 1 tablet (5 mg total) by mouth 3 (three) times daily as needed for muscle spasms. 90 tablet 0   ezetimibe (ZETIA) 10 MG tablet TAKE ONE TABLET BY MOUTH EVERY DAY 30 tablet 2   fluticasone (FLONASE) 50 MCG/ACT nasal spray Place 2 sprays into both nostrils daily. 16 g 6   indomethacin (INDOCIN) 50 MG capsule Take 1 capsule (50 mg total) by mouth 2 (two) times daily with a meal. 60 capsule 0   meloxicam (MOBIC) 15 MG tablet Take 1 tablet (15 mg total) by mouth daily. 30 tablet 0   tadalafil  (CIALIS) 5 MG tablet Take 1 tablet (5 mg total) by mouth daily. 30 tablet 11   testosterone cypionate (DEPOTESTOSTERONE CYPIONATE) 200 MG/ML injection INJECT 1.5 ML INTRAMUSCULARLY EVERY 14 DAYS 10 mL 1   traZODone (DESYREL) 50 MG tablet Take 0.5-1 tablets (25-50 mg total) by mouth at bedtime as needed for sleep. 30 tablet 3   Turmeric (QC TUMERIC COMPLEX PO) Take 2 tablets by mouth daily.     Zinc Sulfate (ZINC 15 PO) Take 1 tablet by mouth daily.     No current facility-administered medications for this visit.    ALLERGIES:   Allergies  Allergen Reactions   Penicillins Hives   Oxycodone-Acetaminophen Nausea And Vomiting   Statins     Muscle pain     FAMILY HISTORY:   Family History  Problem Relation Age of Onset   Colon cancer Mother    Liver cancer Father    Heart disease Father    Ovarian cancer Maternal Aunt    Lung cancer Paternal Uncle    Lung cancer Paternal Uncle     SOCIAL HISTORY:  The patient was born and raised in [place].  He currently lives in town with his wife of 39 years.  They have 1 child and 1 grandchild.  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  There is  no history of alcoholism or tobacco abuse.  REVIEW OF SYSTEMS:  Review of Systems  Constitutional:  Negative for fatigue, fever and unexpected weight change.  Respiratory:  Negative for chest tightness, cough, hemoptysis and shortness of breath.   Cardiovascular:  Negative for chest pain and palpitations.  Gastrointestinal:  Negative for abdominal distention, abdominal pain, blood in stool, constipation, diarrhea, nausea and vomiting.  Genitourinary:  Negative for dysuria, frequency and hematuria.   Musculoskeletal:  Positive for arthralgias. Negative for back pain and myalgias.  Skin:  Negative for itching and rash.  Neurological:  Negative for dizziness, headaches and light-headedness.  Psychiatric/Behavioral:  Negative for depression and  suicidal ideas. The patient is not nervous/anxious.      PHYSICAL EXAM:  Blood pressure (!) 145/88, pulse 60, temperature 97.8 F (36.6 C), resp. rate 18, height 6\' 2"  (1.88 m), weight 257 lb (116.6 kg), SpO2 98%. Wt Readings from Last 3 Encounters:  05/22/23 257 lb (116.6 kg)  04/27/23 250 lb (113.4 kg)  11/03/22 253 lb (114.8 kg)   Body mass index is 33 kg/m. Performance status (ECOG): 0 - Asymptomatic Physical Exam Constitutional:      Appearance: Normal appearance. He is not ill-appearing.  HENT:     Mouth/Throat:     Mouth: Mucous membranes are moist.     Pharynx: Oropharynx is clear. No oropharyngeal exudate or posterior oropharyngeal erythema.  Cardiovascular:     Rate and Rhythm: Normal rate and regular rhythm.     Heart sounds: No murmur heard.    No friction rub. No gallop.  Pulmonary:     Effort: Pulmonary effort is normal. No respiratory distress.     Breath sounds: Normal breath sounds. No wheezing, rhonchi or rales.  Abdominal:     General: Bowel sounds are normal. There is no distension.     Palpations: Abdomen is soft. There is no mass.     Tenderness: There is no abdominal tenderness.     Hernia: A hernia is present. Hernia is present in the umbilical area.  Musculoskeletal:        General: No swelling.     Right lower leg: No edema.     Left lower leg: No edema.  Lymphadenopathy:     Cervical: No cervical adenopathy.     Upper Body:     Right upper body: No supraclavicular or axillary adenopathy.     Left upper body: No supraclavicular or axillary adenopathy.     Lower Body: No right inguinal adenopathy. No left inguinal adenopathy.  Skin:    General: Skin is warm.     Coloration: Skin is not jaundiced.     Findings: No lesion or rash.  Neurological:     General: No focal deficit present.     Mental Status: He is alert and oriented to person, place, and time. Mental status is at baseline.  Psychiatric:        Mood and Affect: Mood normal.         Behavior: Behavior normal.        Thought Content: Thought content normal.    LABS:      Latest Ref Rng & Units 05/22/2023    2:03 PM 04/27/2023   10:22 AM 11/03/2022    8:43 AM  CBC  WBC 4.0 - 10.5 K/uL 6.1  5.3  5.1   Hemoglobin 13.0 - 17.0 g/dL 16.1  09.6  04.5   Hematocrit 39.0 - 52.0 % 50.4  55.1  52.0  Platelets 150 - 400 K/uL 237  241  229       Latest Ref Rng & Units 04/27/2023   10:22 AM 11/03/2022    8:43 AM 05/04/2022   11:56 AM  CMP  Glucose 70 - 99 mg/dL 161  096  045   BUN 8 - 27 mg/dL 14  15  9    Creatinine 0.76 - 1.27 mg/dL 4.09  8.11  9.14   Sodium 134 - 144 mmol/L 136  138  137   Potassium 3.5 - 5.2 mmol/L 4.8  4.7  4.5   Chloride 96 - 106 mmol/L 98  100  101   CO2 20 - 29 mmol/L 23  21  23    Calcium 8.6 - 10.2 mg/dL 9.8  9.1  9.6   Total Protein 6.0 - 8.5 g/dL 7.4  6.8  7.3   Total Bilirubin 0.0 - 1.2 mg/dL 0.8  0.5  0.9   Alkaline Phos 44 - 121 IU/L 67  65  70   AST 0 - 40 IU/L 34  26  23   ALT 0 - 44 IU/L 41  31  27     ASSESSMENT & PLAN:  A 67 y.o. male who I was asked to consult upon for polycythemia.  This is undoubtedly related to his chronic testosterone use.  As long as his hemoglobin remains less than 18, I have no problem with him continuing his testosterone use.  He knows his CBC will need to be followed least once every 3 months.  I also do not have a problem with him being phlebotomized once every 8 weeks, which should help prevent his hemoglobin from further rising while on testosterone therapy.  If his hemoglobin was ever rise above 18 again, then a discussion would need to be had about either decreasing his testosterone dose, spacing out the time in which his doses are given, or both.  Otherwise, as there appear to be no other significant hematologic issues present, I do feel comfortable turning this patient's care back over to his primary care office.  I would not have a problem seeing him in the future if new hematologic issues arise that  require repeat clinical assessment.  The patient understands all the plans discussed today and is in agreement with them.  I do appreciate Cox, Kirsten, MD for his new consult.   Moniqua Engebretsen Kirby Funk, MD

## 2023-05-22 ENCOUNTER — Inpatient Hospital Stay: Payer: BC Managed Care – PPO | Attending: Oncology | Admitting: Oncology

## 2023-05-22 ENCOUNTER — Other Ambulatory Visit: Payer: BC Managed Care – PPO

## 2023-05-22 ENCOUNTER — Encounter: Payer: Self-pay | Admitting: Oncology

## 2023-05-22 VITALS — BP 145/88 | HR 60 | Temp 97.8°F | Resp 18 | Ht 74.0 in | Wt 257.0 lb

## 2023-05-22 DIAGNOSIS — D751 Secondary polycythemia: Secondary | ICD-10-CM

## 2023-05-22 DIAGNOSIS — Z79890 Hormone replacement therapy (postmenopausal): Secondary | ICD-10-CM | POA: Insufficient documentation

## 2023-05-22 DIAGNOSIS — E66811 Obesity, class 1: Secondary | ICD-10-CM | POA: Diagnosis not present

## 2023-05-22 LAB — CBC WITH DIFFERENTIAL (CANCER CENTER ONLY)
Abs Immature Granulocytes: 0.02 10*3/uL (ref 0.00–0.07)
Basophils Absolute: 0.1 10*3/uL (ref 0.0–0.1)
Basophils Relative: 1 %
Eosinophils Absolute: 0.1 10*3/uL (ref 0.0–0.5)
Eosinophils Relative: 2 %
HCT: 50.4 % (ref 39.0–52.0)
Hemoglobin: 17.2 g/dL — ABNORMAL HIGH (ref 13.0–17.0)
Immature Granulocytes: 0 %
Lymphocytes Relative: 32 %
Lymphs Abs: 2 10*3/uL (ref 0.7–4.0)
MCH: 31.7 pg (ref 26.0–34.0)
MCHC: 34.1 g/dL (ref 30.0–36.0)
MCV: 92.8 fL (ref 80.0–100.0)
Monocytes Absolute: 0.6 10*3/uL (ref 0.1–1.0)
Monocytes Relative: 9 %
Neutro Abs: 3.4 10*3/uL (ref 1.7–7.7)
Neutrophils Relative %: 56 %
Platelet Count: 237 10*3/uL (ref 150–400)
RBC: 5.43 MIL/uL (ref 4.22–5.81)
RDW: 12.5 % (ref 11.5–15.5)
WBC Count: 6.1 10*3/uL (ref 4.0–10.5)
nRBC: 0 % (ref 0.0–0.2)
nRBC: 0 /100{WBCs}

## 2023-07-06 ENCOUNTER — Encounter (HOSPITAL_BASED_OUTPATIENT_CLINIC_OR_DEPARTMENT_OTHER): Payer: Self-pay | Admitting: Emergency Medicine

## 2023-07-06 ENCOUNTER — Ambulatory Visit (HOSPITAL_BASED_OUTPATIENT_CLINIC_OR_DEPARTMENT_OTHER)
Admission: EM | Admit: 2023-07-06 | Discharge: 2023-07-06 | Disposition: A | Discharge status: Home / Self Care | Payer: BLUE CROSS/BLUE SHIELD | Attending: Internal Medicine | Admitting: Internal Medicine

## 2023-07-06 DIAGNOSIS — J069 Acute upper respiratory infection, unspecified: Secondary | ICD-10-CM | POA: Diagnosis not present

## 2023-07-06 MED ORDER — PROMETHAZINE-DM 6.25-15 MG/5ML PO SYRP: 5.0000 mL | ORAL_SOLUTION | Freq: Four times a day (QID) | ORAL | 0 refills | Status: AC | PRN

## 2023-07-06 NOTE — ED Triage Notes (Signed)
Pt c/o congestion, runny nose, cough x 4 days. Pt has taken coricidin and mucinex which has helped some.

## 2023-07-06 NOTE — ED Provider Notes (Signed)
[facility]    CSN: [id number] Arrival date & time: 07/06/23  1040      History   Chief Complaint Chief Complaint  Patient presents with   Nasal Congestion    HPI James Craig is a 67 y.o. male.   HPI Sick for 4 days symptoms include rhinorrhea, nasal congestion and nonproductive cough.  Admits ear pressure.  Had subjective low-grade fever first day of illness resolved.  Denies body aches, fatigue, chest pain, shortness of breath, nausea, vomiting, diarrhea, rash or skin changes.  Admits possible family contacts with URI symptoms.  Denies confirmed COVID or flu exposures.  Taking over-the-counter Coricidin without relief.  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   VASECTOMY         Home Medications    Prior to Admission medications   Medication Sig Start Date End Date Taking? Authorizing Provider  promethazine-dextromethorphan (PROMETHAZINE-DM) 6.25-15 MG/5ML syrup Take 5 mLs by mouth 4 (four) times daily as needed for up to 6 days for cough. 07/06/23  07/12/23 Yes Meliton Rattan, PA  cyclobenzaprine (FLEXERIL) 5 MG tablet Take 1 tablet (5 mg total) by mouth 3 (three) times daily as needed for muscle spasms. 12/28/21   Cox, Fritzi Mandes, MD  ezetimibe (ZETIA) 10 MG tablet TAKE ONE TABLET BY MOUTH EVERY DAY 05/02/23   Cox, Kirsten, MD  fluticasone [facility]) 50 MCG/ACT nasal spray Place 2 sprays into both nostrils daily. 01/06/21   CoxFritzi Mandes, MD  indomethacin (INDOCIN) 50 MG capsule Take 1 capsule (50 mg total) by mouth 2 (two) times daily with a meal. 03/27/22   Cox, Kirsten, MD  meloxicam (MOBIC) 15 MG tablet Take 1 tablet (15 mg total) by mouth daily. 01/18/22   Cox, Fritzi Mandes, MD  tadalafil (CIALIS) 5 MG tablet Take 1 tablet (5 mg total) by mouth daily. 04/27/23   Langley Gauss, PA  testosterone cypionate (DEPOTESTOSTERONE CYPIONATE) 200 MG/ML injection INJECT 1.5 ML INTRAMUSCULARLY EVERY 14 DAYS 02/05/23   Cox, Fritzi Mandes, MD  traZODone (DESYREL) 50 MG tablet Take 0.5-1 tablets (25-50 mg total) by mouth at bedtime as needed for sleep. 01/19/21   Janie Morning, NP  Turmeric (QC TUMERIC COMPLEX PO) Take 2 tablets by mouth daily.    [provider]  Zinc Sulfate (ZINC 15 PO) Take 1 tablet by mouth daily.    [provider]    Family History Family History  Problem Relation Age of Onset   Colon cancer Mother    Liver cancer Father    Heart disease Father    Ovarian cancer Maternal Aunt  Lung cancer Paternal Uncle    Lung cancer Paternal Uncle     Social History Social History   Tobacco Use   Smoking status: Never   Smokeless tobacco: Never  Vaping Use   Vaping status: Never Used  Substance Use Topics   Alcohol use: Yes    Alcohol/week: 1.0 standard drink of alcohol    Types: 1 Glasses of wine per week    Comment: socially   Drug use: Not Currently     Allergies   Penicillins, Oxycodone-acetaminophen, and Statins   Review of Systems Review of Systems  Constitutional:  Negative for chills, diaphoresis and  fatigue.  HENT:  Positive for congestion, ear pain, rhinorrhea, sinus pressure and sore throat. Negative for trouble swallowing.   Respiratory:  Positive for cough. Negative for shortness of breath.   Cardiovascular:  Negative for chest pain.  Gastrointestinal:  Negative for abdominal pain, diarrhea, nausea and vomiting.  Skin:  Negative for rash.  Neurological:  Negative for headaches.     Physical Exam Triage Vital Signs ED Triage Vitals  Encounter Vitals Group     BP 07/06/23 1054 118/69     Systolic BP Percentile --      Diastolic BP Percentile --      Pulse Rate 07/06/23 1054 75     Resp 07/06/23 1054 20     Temp 07/06/23 1054 98.3 F (36.8 C)     Temp Source 07/06/23 1054 Oral     SpO2 07/06/23 1054 96 %     Weight --      Height --      Head Circumference --      Peak Flow --      Pain Score 07/06/23 1053 0     Pain Loc --      Pain Education --      Exclude from Growth Chart --    No data found.  Updated Vital Signs BP 118/69 (BP Location: Right Arm)   Pulse 75   Temp 98.3 F (36.8 C) (Oral)   Resp 20   SpO2 96%   Visual Acuity Right Eye Distance:   Left Eye Distance:   Bilateral Distance:    Right Eye Near:   Left Eye Near:    Bilateral Near:     Physical Exam Vitals and nursing note reviewed.  Constitutional:      Appearance: He is not ill-appearing.  HENT:     Head: Normocephalic and atraumatic.     Right Ear: Tympanic membrane and ear canal normal.     Left Ear: Tympanic membrane and ear canal normal.     Nose: Congestion present. No rhinorrhea.     Mouth/Throat:     Mouth: Mucous membranes are moist.     Pharynx: Oropharynx is clear. No oropharyngeal exudate or posterior oropharyngeal erythema.  Eyes:     Conjunctiva/sclera: Conjunctivae normal.  Cardiovascular:     Rate and Rhythm: Normal rate and regular rhythm.     Heart sounds: Normal heart sounds.  Pulmonary:     Effort: Pulmonary effort is normal. No respiratory distress.      Breath sounds: Normal breath sounds. No rhonchi or rales.  Musculoskeletal:     Cervical back: Neck supple.  Lymphadenopathy:     Cervical: No cervical adenopathy.  Neurological:     Mental Status: He is alert and oriented to person, place, and time.  Psychiatric:        Mood and Affect: Mood normal.  UC Treatments / Results  Labs (all labs ordered are listed, but only abnormal results are displayed) Labs Reviewed - No data to display  EKG   Radiology No results found.  Procedures Procedures (including critical care time)  Medications Ordered in UC Medications - No data to display  Initial Impression / Assessment and Plan / UC Course  I have reviewed the triage vital signs and the nursing notes.  Pertinent labs & imaging results that were available during my care of the patient were reviewed by me and considered in my medical decision making (see chart for details).    Well-appearing 67 year old with URI symptoms for 4 days, afebrile, nontoxic, nasal congestion on exam otherwise normal.  Discussed with patient likely viral syndrome will Rx cough medicine, signs and follow-up reviewed Final Clinical Impressions(s) / UC Diagnoses   Final diagnoses:  Acute upper respiratory infection   Discharge Instructions   None    ED Prescriptions     Medication Sig Dispense Auth. Provider   promethazine-dextromethorphan (PROMETHAZINE-DM) 6.25-15 MG/5ML syrup Take 5 mLs by mouth 4 (four) times daily as needed for up to 6 days for cough. 118 mL Meliton Rattan, [place]      PDMP not reviewed this encounter.   Meliton Rattan, [place] 07/06/23 1110

## 2023-09-04 ENCOUNTER — Other Ambulatory Visit: Payer: Self-pay | Admitting: Family Medicine

## 2023-09-06 ENCOUNTER — Other Ambulatory Visit: Payer: Self-pay

## 2023-09-06 MED ORDER — TESTOSTERONE CYPIONATE 200 MG/ML IM SOLN: 300.0000 mg | INTRAMUSCULAR | 1 refills | Status: DC

## 2023-10-26 ENCOUNTER — Encounter: Payer: Self-pay | Admitting: Physician Assistant

## 2023-10-26 ENCOUNTER — Ambulatory Visit (INDEPENDENT_AMBULATORY_CARE_PROVIDER_SITE_OTHER): Payer: BC Managed Care – PPO | Admitting: Physician Assistant

## 2023-10-26 VITALS — BP 100/80 | HR 68 | Temp 97.3°F | Resp 14 | Ht 74.0 in | Wt 254.0 lb

## 2023-10-26 DIAGNOSIS — E782 Mixed hyperlipidemia: Secondary | ICD-10-CM | POA: Diagnosis not present

## 2023-10-26 DIAGNOSIS — M5440 Lumbago with sciatica, unspecified side: Secondary | ICD-10-CM

## 2023-10-26 DIAGNOSIS — R7303 Prediabetes: Secondary | ICD-10-CM

## 2023-10-26 DIAGNOSIS — R030 Elevated blood-pressure reading, without diagnosis of hypertension: Secondary | ICD-10-CM | POA: Diagnosis not present

## 2023-10-26 DIAGNOSIS — E291 Testicular hypofunction: Secondary | ICD-10-CM | POA: Diagnosis not present

## 2023-10-26 NOTE — Assessment & Plan Note (Signed)
 Admits to having recent readings of high BP at a few different offices Will continue to monitor Labs drawn today Will adjust treatment based on results BP Readings from Last 3 Encounters:  10/26/23 100/80  07/06/23 118/69  05/22/23 (!) 145/88

## 2023-10-26 NOTE — Progress Notes (Signed)
 Subjective:  Patient ID: James Craig, male    DOB: [birth year]  Age: 68 y.o. MRN: [id number]  Chief Complaint  Patient presents with   Medical Management of Chronic Issues    Discussed the use of AI scribe software for clinical note transcription with the patient, who gave verbal consent to proceed.  Hypertension: No taking medicine.  High Cholesterol: Zetia  10 mg daily.  Insomnia: Trazodone  50 mg daily taking PRN.  Hypogonadism: Using testosterone  200 mg 1.0 ml  every 14 days.  Chronic back pain: Currently on Meloxicam  15 mg daily, flexeril  5 mg daily.   Discussed the use of AI scribe software for clinical note transcription with the patient, who gave verbal consent to proceed.  History of Present Illness   James Craig, a patient with a history of hernia surgery, back surgery, and low testosterone  levels, presents for a routine check-up. The patient reports feeling well overall since his last visit. He mentions a successful hernia surgery and is currently on testosterone  injections every two weeks, which he reports are going well.  The patient expresses a desire to lose weight and has been trying to modify his diet and exercise routine. He mentions a previous successful weight loss of 32 pounds in 12 weeks, achieved through diet modification and increased physical activity. However, he is finding it more challenging to lose weight this time.  The patient also reports numbness in his toes, which started before his back surgery and has persisted. He describes the numbness as a dull pain that extends from the hip down the leg, particularly noticeable when he is tired in the evenings. The pain is somewhat relieved with rest. The patient has previously sought neurological consultation and physical therapy for this issue, with minimal improvement.  The patient is also on Flonase  and Claritin for allergies, which he reports are effective.          05/22/2023    3:11 PM 04/27/2023     9:34 AM 05/04/2022   10:34 AM 02/16/2021    8:21 AM 01/06/2021   11:03 AM  Depression screen PHQ 2/9  Decreased Interest 0 0 0 0 0  Down, Depressed, Hopeless 0 0 0 0 0  PHQ - 2 Score 0 0 0 0 0  Altered sleeping  1 2    Tired, decreased energy  0 2    Change in appetite  0 0    Feeling bad or failure about yourself   0 0    Trouble concentrating  0 0    Moving slowly or fidgety/restless  0 0    Suicidal thoughts  0 0    PHQ-9 Score  1 4    Difficult doing work/chores  Not difficult at all Somewhat difficult          04/27/2023    9:33 AM  Fall Risk   Falls in the past year? 0  Number falls in past yr: 0  Injury with Fall? 0  Risk for fall due to : No Fall Risks  Follow up Falls prevention discussed    Patient Care Team: Mercy Stall, MD as PCP - General (Family Medicine)   Review of Systems  Constitutional:  Negative for chills, fatigue, fever and unexpected weight change.  HENT:  Negative for congestion, ear pain, sinus pain and sore throat.   Respiratory:  Negative for cough and shortness of breath.   Cardiovascular:  Negative for chest pain and palpitations.  Gastrointestinal:  Negative for abdominal pain, blood  in stool, constipation, diarrhea, nausea and vomiting.  Endocrine: Negative for polydipsia.  Genitourinary:  Negative for dysuria.  Musculoskeletal:  Negative for back pain.  Skin:  Negative for rash.  Neurological:  Negative for headaches.    Current Outpatient Medications on File Prior to Visit  Medication Sig Dispense Refill   cyclobenzaprine  (FLEXERIL ) 5 MG tablet Take 1 tablet (5 mg total) by mouth 3 (three) times daily as needed for muscle spasms. 90 tablet 0   ezetimibe  (ZETIA ) 10 MG tablet TAKE ONE TABLET BY MOUTH EVERY DAY 30 tablet 2   fluticasone  (FLONASE ) 50 MCG/ACT nasal spray Place 2 sprays into both nostrils daily. 16 g 6   indomethacin  (INDOCIN ) 50 MG capsule Take 1 capsule (50 mg total) by mouth 2 (two) times daily with a meal. 60 capsule 0    loratadine (CLARITIN) 10 MG tablet Take 10 mg by mouth daily.     meloxicam  (MOBIC ) 15 MG tablet Take 1 tablet (15 mg total) by mouth daily. 30 tablet 0   tadalafil  (CIALIS ) 5 MG tablet Take 1 tablet (5 mg total) by mouth daily. 30 tablet 11   testosterone  cypionate (DEPOTESTOSTERONE CYPIONATE) 200 MG/ML injection Inject 1.5 mLs (300 mg total) into the muscle every 14 (fourteen) days. 10 mL 1   traZODone  (DESYREL ) 50 MG tablet Take 0.5-1 tablets (25-50 mg total) by mouth at bedtime as needed for sleep. 30 tablet 3   Turmeric (QC TUMERIC COMPLEX PO) Take 2 tablets by mouth daily.     No current facility-administered medications on file prior to visit.   Past Medical History:  Diagnosis Date   Hyperlipidemia    Past Surgical History:  Procedure Laterality Date   ANAL CYST EXCISION     CATARACT EXTRACTION Bilateral    lumbar back pain  04/10/2022   removed synovial cyst and treated spinal stenosis.   VASECTOMY      Family History  Problem Relation Age of Onset   Colon cancer Mother    Liver cancer Father    Heart disease Father    Ovarian cancer Maternal Aunt    Lung cancer Paternal Uncle    Lung cancer Paternal Uncle    Social History   Socioeconomic History   Marital status: Married    Spouse name: James Craig   Number of children: 1   Years of education: 12 + SOME COLLEGE   Highest education level: 12th grade  Occupational History    Employer: [employer]  Tobacco Use   Smoking status: Never   Smokeless tobacco: Never  Vaping Use   Vaping status: Never Used  Substance and Sexual Activity   Alcohol use: Yes    Alcohol/week: 1.0 standard drink of alcohol    Types: 1 Glasses of wine per week    Comment: socially   Drug use: Not Currently   Sexual activity: Yes    Partners: Female  Other Topics Concern   Not on file  Social History Narrative   Not on file   Social Drivers of Health   Financial Resource Strain: Low Risk  (10/25/2023)   Overall Financial  Resource Strain (CARDIA)    Difficulty of Paying Living Expenses: Not hard at all  Food Insecurity: No Food Insecurity (10/25/2023)   Hunger Vital Sign    Worried About Running Out of Food in the Last Year: Never true    Ran Out of Food in the Last Year: Never true  Transportation Needs: No Transportation Needs (10/25/2023)   PRAPARE -  Administrator, Civil Service (Medical): No    Lack of Transportation (Non-Medical): No  Physical Activity: Insufficiently Active (10/25/2023)   Exercise Vital Sign    Days of Exercise per Week: 2 days    Minutes of Exercise per Session: 20 min  Stress: No Stress Concern Present (10/25/2023)   [employer] of Occupational Health - Occupational Stress Questionnaire    Feeling of Stress : Only a little  Social Connections: Socially Integrated (10/25/2023)   Social Connection and Isolation Panel [NHANES]    Frequency of Communication with Friends and Family: More than three times a week    Frequency of Social Gatherings with Friends and Family: Once a week    Attends Religious Services: More than 4 times per year    Active Member of [employer] or Organizations: Yes    Attends Engineer, structural: More than 4 times per year    Marital Status: Married    Objective:  BP 100/80   Pulse 68   Temp (!) 97.3 F (36.3 C)   Resp 14   Ht 6\' 2"  (1.88 m)   Wt 254 lb (115.2 kg)   SpO2 98%   BMI 32.61 kg/m      10/26/2023    8:15 AM 07/06/2023   10:54 AM 05/22/2023    2:45 PM  BP/Weight  Systolic BP 100 118 145  Diastolic BP 80 69 88  Wt. (Lbs) 254  257  BMI 32.61 kg/m2  33 kg/m2    Physical Exam Vitals reviewed.  Constitutional:      Appearance: Normal appearance.  Cardiovascular:     Rate and Rhythm: Normal rate and regular rhythm.     Heart sounds: Normal heart sounds.  Pulmonary:     Effort: Pulmonary effort is normal.     Breath sounds: Normal breath sounds.  Abdominal:     General: Bowel sounds are normal.      Palpations: Abdomen is soft.     Tenderness: There is no abdominal tenderness.  Musculoskeletal:     Lumbar back: Spasms and tenderness present. No swelling or edema. Normal range of motion.  Neurological:     Mental Status: He is alert and oriented to person, place, and time.  Psychiatric:        Mood and Affect: Mood normal.        Behavior: Behavior normal.     Diabetic Foot Exam - Simple   No data filed      Lab Results  Component Value Date   WBC 6.1 05/22/2023   HGB 17.2 (H) 05/22/2023   HCT 50.4 05/22/2023   PLT 237 05/22/2023   GLUCOSE 115 (H) 04/27/2023   CHOL 245 (H) 04/27/2023   TRIG 116 04/27/2023   HDL 54 04/27/2023   LDLCALC 170 (H) 04/27/2023   ALT 41 04/27/2023   AST 34 04/27/2023   NA 136 04/27/2023   K 4.8 04/27/2023   CL 98 04/27/2023   CREATININE 1.04 04/27/2023   BUN 14 04/27/2023   CO2 23 04/27/2023   TSH 2.470 05/04/2022   HGBA1C 6.1 (H) 04/27/2023      Assessment & Plan:   Mixed hyperlipidemia Assessment & Plan: Well controlled.  Continue to work on eating a healthy diet and exercise.  Labs drawn today.   No major side effects reported, and no issues with compliance. The current medical regimen is effective;  continue present plan with Zetia  10mg  Will adjust medication as needed depending on labs Lab  Results  Component Value Date   LDLCALC 170 (H) 04/27/2023     Orders: -     CBC with Differential/Platelet -     Comprehensive metabolic panel with GFR -     Lipid panel  Prediabetes Assessment & Plan: Controlled Continue to monitor diet and exercise Labs drawn today Will adjust treatment as needed  Lab Results  Component Value Date   HGBA1C 6.1 (H) 04/27/2023   HGBA1C 6.0 (H) 11/03/2022   HGBA1C 5.8 (H) 05/04/2022     Orders: -     Hemoglobin A1c  Testicular hypofunction Assessment & Plan: On biweekly testosterone  injections for low testosterone . Current treatment effective with no major side effects. Monitoring  necessary to maintain therapeutic range.   - Continue testosterone  injections every two weeks.   - Monitor testosterone  levels to ensure they are within the therapeutic range.   Orders: -     Testosterone ,Free and Total  Elevated BP without diagnosis of hypertension Assessment & Plan: Admits to having recent readings of high BP at a few different offices Will continue to monitor Labs drawn today Will adjust treatment based on results BP Readings from Last 3 Encounters:  10/26/23 100/80  07/06/23 118/69  05/22/23 (!) 145/88      Back pain of lumbar region with sciatica Assessment & Plan: Chronic low back pain with right leg numbness and pain, post-surgery for spinal cyst. Previous therapies ineffective. Possible nerve compression. Nerve conduction studies discussed.   - Recommend nerve conduction study to determine nerve compression site.   - Advise continuation of stretching exercises, including knee-to-chest stretches and using a tennis ball for gluteal muscle release.   - Encourage weight loss and regular exercise.   - Consider neurology referral if symptoms persist.       No orders of the defined types were placed in this encounter.   Orders Placed This Encounter  Procedures   CBC with Differential/Platelet   Comprehensive metabolic panel with GFR   Lipid panel   Hemoglobin A1c   Testosterone ,Free and Total     Follow-up: Return in about 6 months (around 04/26/2024) for Chronic, Mirian Ames.   I,Marla I Leal-Borjas,acting as a scribe for [employer], PA.,have documented all relevant documentation on the behalf of Odilia Bennett, PA,as directed by  Odilia Bennett, PA while in the presence of Odilia Bennett, [place].   An After Visit Summary was printed and given to the patient.  Odilia Bennett, [place] [facility] [phone]

## 2023-10-26 NOTE — Patient Instructions (Signed)
 VISIT SUMMARY:  During your routine check-up, we discussed your overall well-being and addressed several ongoing health concerns. You reported feeling well since your last visit, with successful hernia surgery and effective testosterone  injections. We also talked about your weight loss efforts, persistent numbness in your toes, and your current allergy medications.  YOUR PLAN:  -CHRONIC LOW BACK PAIN WITH SCIATICA: Chronic low back pain with sciatica is a condition where pain radiates from the lower back down the leg, often due to nerve compression. We recommend a nerve conduction study to identify the site of nerve compression. Continue with your stretching exercises, including knee-to-chest stretches and using a tennis ball for gluteal muscle release. Keep up with your weight loss efforts and regular exercise. If symptoms persist, we may consider a referral to a neurologist.  -OBESITY: Obesity is a condition characterized by excessive body fat. Despite your efforts, losing weight has been challenging. We encourage you to focus on dietary changes, such as eating lean proteins and reducing fats and carbohydrates. Regular exercise, including swimming and spin classes, is also recommended. If lifestyle changes are not enough, we can discuss the potential use of weight loss medications.  -HYPOGONADISM: Hypogonadism is a condition where the body doesn't produce enough testosterone . Your current biweekly testosterone  injections are effective, and we will continue this treatment. We will also monitor your testosterone  levels to ensure they remain within the therapeutic range.  INSTRUCTIONS:  Please schedule a nerve conduction study to determine the site of nerve compression. Continue with your current exercise and dietary plans, and consider adding swimming or spin classes to your routine. We will monitor your testosterone  levels regularly to ensure they stay within the therapeutic range. If your symptoms  persist, we may refer you to a neurologist.

## 2023-10-26 NOTE — Assessment & Plan Note (Signed)
 Controlled Continue to monitor diet and exercise Labs drawn today Will adjust treatment as needed  Lab Results  Component Value Date   HGBA1C 6.1 (H) 04/27/2023   HGBA1C 6.0 (H) 11/03/2022   HGBA1C 5.8 (H) 05/04/2022

## 2023-10-26 NOTE — Assessment & Plan Note (Signed)
 Well controlled.  Continue to work on eating a healthy diet and exercise.  Labs drawn today.   No major side effects reported, and no issues with compliance. The current medical regimen is effective;  continue present plan with Zetia  10mg  Will adjust medication as needed depending on labs Lab Results  Component Value Date   LDLCALC 170 (H) 04/27/2023

## 2023-10-26 NOTE — Assessment & Plan Note (Signed)
 Chronic low back pain with right leg numbness and pain, post-surgery for spinal cyst. Previous therapies ineffective. Possible nerve compression. Nerve conduction studies discussed.   - Recommend nerve conduction study to determine nerve compression site.   - Advise continuation of stretching exercises, including knee-to-chest stretches and using a tennis ball for gluteal muscle release.   - Encourage weight loss and regular exercise.   - Consider neurology referral if symptoms persist.

## 2023-10-26 NOTE — Assessment & Plan Note (Signed)
 On biweekly testosterone  injections for low testosterone . Current treatment effective with no major side effects. Monitoring necessary to maintain therapeutic range.   - Continue testosterone  injections every two weeks.   - Monitor testosterone  levels to ensure they are within the therapeutic range.

## 2023-10-27 LAB — COMPREHENSIVE METABOLIC PANEL WITH GFR
ALT: 30 IU/L (ref 0–44)
AST: 26 IU/L (ref 0–40)
Albumin: 4.6 g/dL (ref 3.9–4.9)
Alkaline Phosphatase: 64 IU/L (ref 44–121)
BUN/Creatinine Ratio: 13 (ref 10–24)
BUN: 14 mg/dL (ref 8–27)
Bilirubin Total: 1 mg/dL (ref 0.0–1.2)
CO2: 21 mmol/L (ref 20–29)
Calcium: 9.6 mg/dL (ref 8.6–10.2)
Chloride: 99 mmol/L (ref 96–106)
Creatinine, Ser: 1.07 mg/dL (ref 0.76–1.27)
Globulin, Total: 2.3 g/dL (ref 1.5–4.5)
Glucose: 102 mg/dL — ABNORMAL HIGH (ref 70–99)
Potassium: 4.6 mmol/L (ref 3.5–5.2)
Sodium: 137 mmol/L (ref 134–144)
Total Protein: 6.9 g/dL (ref 6.0–8.5)
eGFR: 77 mL/min/{1.73_m2} (ref >59)

## 2023-10-27 LAB — CBC WITH DIFFERENTIAL/PLATELET
Basophils Absolute: 0.1 10*3/uL (ref 0.0–0.2)
Basos: 1 %
EOS (ABSOLUTE): 0.1 10*3/uL (ref 0.0–0.4)
Eos: 2 %
Hematocrit: 55.4 % — ABNORMAL HIGH (ref 37.5–51.0)
Hemoglobin: 18.8 g/dL — ABNORMAL HIGH (ref 13.0–17.7)
Immature Grans (Abs): 0 10*3/uL (ref 0.0–0.1)
Immature Granulocytes: 0 %
Lymphocytes Absolute: 1.6 10*3/uL (ref 0.7–3.1)
Lymphs: 30 %
MCH: 30.9 pg (ref 26.6–33.0)
MCHC: 33.9 g/dL (ref 31.5–35.7)
MCV: 91 fL (ref 79–97)
Monocytes Absolute: 0.5 10*3/uL (ref 0.1–0.9)
Monocytes: 9 %
Neutrophils Absolute: 3.1 10*3/uL (ref 1.4–7.0)
Neutrophils: 58 %
Platelets: 240 10*3/uL (ref 150–450)
RBC: 6.09 x10E6/uL — ABNORMAL HIGH (ref 4.14–5.80)
RDW: 13.6 % (ref 11.6–15.4)
WBC: 5.3 10*3/uL (ref 3.4–10.8)

## 2023-10-27 LAB — HEMOGLOBIN A1C
Est. average glucose Bld gHb Est-mCnc: 134 mg/dL
Hgb A1c MFr Bld: 6.3 % — ABNORMAL HIGH (ref 4.8–5.6)

## 2023-10-27 LAB — TESTOSTERONE,FREE AND TOTAL
Testosterone, Free: 11.3 pg/mL (ref 6.6–18.1)
Testosterone: 254 ng/dL — ABNORMAL LOW (ref 264–916)

## 2023-10-27 LAB — LIPID PANEL
Chol/HDL Ratio: 4.5 ratio (ref 0.0–5.0)
Cholesterol, Total: 210 mg/dL — ABNORMAL HIGH (ref 100–199)
HDL: 47 mg/dL (ref >39)
LDL Chol Calc (NIH): 143 mg/dL — ABNORMAL HIGH (ref 0–99)
Triglycerides: 111 mg/dL (ref 0–149)
VLDL Cholesterol Cal: 20 mg/dL (ref 5–40)

## 2023-10-30 ENCOUNTER — Encounter: Payer: Self-pay | Admitting: Physician Assistant

## 2023-11-12 ENCOUNTER — Other Ambulatory Visit: Payer: Self-pay | Admitting: Family Medicine

## 2023-11-12 ENCOUNTER — Ambulatory Visit: Payer: Self-pay

## 2023-11-12 ENCOUNTER — Other Ambulatory Visit: Payer: Self-pay | Admitting: Physician Assistant

## 2023-11-12 MED ORDER — TESTOSTERONE CYPIONATE 200 MG/ML IM SOLN: 350.0000 mg | INTRAMUSCULAR | 1 refills | Status: DC

## 2023-11-12 NOTE — Telephone Encounter (Signed)
 Copied from CRM [id number]. Topic: Clinical - Medication Question >> Nov 12, 2023  8:39 AM James Craig wrote: Reason for CRM: patient called to see if provider would increase his testosterone  cypionate (DEPOTESTOSTERONE CYPIONATE) 200 MG/ML injection from 1.5MLS to 1.75MLS. Please f/u with patient.

## 2023-11-12 NOTE — Telephone Encounter (Signed)
 New prescription sent for higher dose. Dr. Reinhold Carbine.

## 2023-11-13 NOTE — Telephone Encounter (Signed)
Spoke with patient, verbalized understanding and had no questions at this time.  

## 2024-04-25 ENCOUNTER — Encounter: Payer: Self-pay | Admitting: Physician Assistant

## 2024-04-25 ENCOUNTER — Ambulatory Visit: Admitting: Family Medicine

## 2024-04-25 VITALS — BP 126/78 | HR 60 | Temp 98.2°F | Resp 18 | Ht 74.0 in | Wt 252.0 lb

## 2024-04-25 DIAGNOSIS — Z23 Encounter for immunization: Secondary | ICD-10-CM

## 2024-04-25 DIAGNOSIS — J018 Other acute sinusitis: Secondary | ICD-10-CM

## 2024-04-25 DIAGNOSIS — E782 Mixed hyperlipidemia: Secondary | ICD-10-CM

## 2024-04-25 DIAGNOSIS — R7303 Prediabetes: Secondary | ICD-10-CM

## 2024-04-25 DIAGNOSIS — E66811 Obesity, class 1: Secondary | ICD-10-CM

## 2024-04-25 DIAGNOSIS — J301 Allergic rhinitis due to pollen: Secondary | ICD-10-CM

## 2024-04-25 DIAGNOSIS — M1A00X0 Idiopathic chronic gout, unspecified site, without tophus (tophi): Secondary | ICD-10-CM

## 2024-04-25 DIAGNOSIS — G4709 Other insomnia: Secondary | ICD-10-CM | POA: Diagnosis not present

## 2024-04-25 DIAGNOSIS — E6609 Other obesity due to excess calories: Secondary | ICD-10-CM

## 2024-04-25 DIAGNOSIS — Z6832 Body mass index (BMI) 32.0-32.9, adult: Secondary | ICD-10-CM

## 2024-04-25 LAB — CBC WITH DIFFERENTIAL/PLATELET
Basophils Absolute: 0.1 x10E3/uL (ref 0.0–0.2)
Basos: 1 %
EOS (ABSOLUTE): 0.1 x10E3/uL (ref 0.0–0.4)
Eos: 1 %
Hematocrit: 56.3 % — ABNORMAL HIGH (ref 37.5–51.0)
Hemoglobin: 18.5 g/dL — ABNORMAL HIGH (ref 13.0–17.7)
Immature Grans (Abs): 0 x10E3/uL (ref 0.0–0.1)
Immature Granulocytes: 0 %
Lymphocytes Absolute: 1.3 x10E3/uL (ref 0.7–3.1)
Lymphs: 18 %
MCH: 31.1 pg (ref 26.6–33.0)
MCHC: 32.9 g/dL (ref 31.5–35.7)
MCV: 95 fL (ref 79–97)
Monocytes Absolute: 0.6 x10E3/uL (ref 0.1–0.9)
Monocytes: 8 %
Neutrophils Absolute: 5.2 x10E3/uL (ref 1.4–7.0)
Neutrophils: 72 %
Platelets: 234 x10E3/uL (ref 150–450)
RBC: 5.95 x10E6/uL — ABNORMAL HIGH (ref 4.14–5.80)
RDW: 13.5 % (ref 11.6–15.4)
WBC: 7.2 x10E3/uL (ref 3.4–10.8)

## 2024-04-25 LAB — POCT LIPID PANEL
HDL: 33
LDL: 125
Non-HDL: 152
TC: 185
TRG: 134

## 2024-04-25 LAB — POCT GLYCOSYLATED HEMOGLOBIN (HGB A1C): Hemoglobin A1C: 5.9 % — AB (ref 4.0–5.6)

## 2024-04-25 MED ORDER — CEFDINIR 300 MG PO CAPS: 300.0000 mg | ORAL_CAPSULE | Freq: Two times a day (BID) | ORAL | 0 refills | Status: AC

## 2024-04-25 MED ORDER — INDOMETHACIN 50 MG PO CAPS: 50.0000 mg | ORAL_CAPSULE | Freq: Two times a day (BID) | ORAL | 1 refills | Status: AC | PRN

## 2024-04-25 MED ORDER — HYDROCODONE BIT-HOMATROP MBR 5-1.5 MG/5ML PO SOLN: 5.0000 mL | Freq: Four times a day (QID) | ORAL | 0 refills | Status: AC | PRN

## 2024-04-25 NOTE — Patient Instructions (Signed)
  VISIT SUMMARY: Today, you were seen for a sinus infection and discussed several ongoing health issues including allergies, gout, prediabetes, hyperlipidemia, testosterone  therapy, and insomnia.  YOUR PLAN: ACUTE SINUSITIS: You have a sinus infection with symptoms like nasal redness, swelling, and tenderness under your eyes. -Continue using Claritin and Flonase  as needed for your allergies. -Sent omnifef and hydrocodone cough syrup.   GOUT: You have a history of gout with occasional flare-ups, which have been managed with indomethacin  and dietary changes. -Continue using indomethacin  as needed for flare-ups. -Maintain dietary modifications to help prevent future flare-ups.  PREDIABETES: Your blood sugar levels have improved, with a recent A1c of 5.9%. -Continue with your current dietary changes and weight management.  MIXED HYPERLIPIDEMIA: You have high cholesterol but are not currently taking the prescribed medication. -Discuss the importance of taking medication for managing your cholesterol. Recommend start zetia  10 mg daily.  TESTOSTERONE  DEFICIENCY: You are managing low testosterone  with bi-weekly injections and have adjusted the dose for better tolerance. -Continue testosterone  injections at your preferred dose of 1.5 units. -Monitor your symptoms and consider checking testosterone  levels if needed.  INSOMNIA: You have occasional trouble sleeping, which you manage with trazodone . -Consider using trazodone  more regularly if your insomnia continues.                      Contains text generated by [employer].                                 Contains text generated by [employer].

## 2024-04-25 NOTE — Progress Notes (Signed)
 Subjective:  Patient ID: James Craig, male    DOB: October 27, 1955  Age: 68 y.o. MRN: [id number]  Chief Complaint  Patient presents with   Medical Management of Chronic Issues   Discussed the use of AI scribe software for clinical note transcription with the patient, who gave verbal consent to proceed.  History of Present Illness James Craig is a 68 year old male who presents with a sinus infection.  Upper respiratory symptoms - Nasal redness and swelling present - Intermittent sore throat, not present today - Tenderness under the eyes - No current respiratory symptoms - Raspy voice two weeks ago, resolved - Uses Claritin and Flonase  for allergies  Gout symptoms and management - History of gout with episodic flares - Recent episode of pain, warmth, and redness on the top of the foot, resolved after indomethacin  - Indomethacin  used approximately four times this year as needed - Dietary modifications include reduced pork consumption, associated with improvement  Testosterone  therapy and hematologic effects - On testosterone  injections every two weeks - Self-administering 1.5 units instead of prescribed 1.75 units due to improved well-being at lower dose - Last injection two weeks ago, next dose due today - Hemoglobin previously elevated at 18-19, most recent value 16 - Regular blood donation to manage elevated hemoglobin  Hyperlipidemia and lifestyle modification - History of hyperlipidemia - Previously prescribed zetia , not currently taking due to preference to avoid medications - Dietary improvements include healthier food choices, avoidance of eating out for breakfast, and switch to lower-calorie wheat bread - Recent weight loss of a few pounds         05/03/2024    8:41 PM 05/22/2023    3:11 PM 04/27/2023    9:34 AM 05/04/2022   10:34 AM 02/16/2021    8:21 AM  Depression screen PHQ 2/9  Decreased Interest 0 0 0 0 0  Down, Depressed, Hopeless 0 0 0 0 0  PHQ  - 2 Score 0 0 0 0 0  Altered sleeping   1 2   Tired, decreased energy   0 2   Change in appetite   0 0   Feeling bad or failure about yourself    0 0   Trouble concentrating   0 0   Moving slowly or fidgety/restless   0 0   Suicidal thoughts   0 0   PHQ-9 Score   1 4   Difficult doing work/chores   Not difficult at all Somewhat difficult         04/27/2023    9:33 AM  Fall Risk   Falls in the past year? 0  Number falls in past yr: 0  Injury with Fall? 0  Risk for fall due to : No Fall Risks  Follow up Falls prevention discussed    Patient Care Team: Sherre Clapper, MD as PCP - General (Family Medicine)   Review of Systems  Constitutional:  Negative for appetite change, chills, fatigue and fever.  HENT:  Positive for congestion, postnasal drip, rhinorrhea, sinus pressure, sinus pain and sneezing. Negative for ear pain and sore throat.   Eyes: Negative.   Respiratory:  Positive for cough. Negative for chest tightness, shortness of breath and wheezing.   Cardiovascular:  Negative for chest pain and palpitations.  Gastrointestinal:  Negative for abdominal pain, constipation, diarrhea, nausea and vomiting.  Endocrine: Negative.   Genitourinary:  Negative for dysuria, frequency, hematuria and urgency.  Musculoskeletal:  Negative for arthralgias, back pain, joint swelling and myalgias.  Skin:  Negative for rash.  Allergic/Immunologic: Negative.   Neurological:  Negative for dizziness, weakness, light-headedness and headaches.  Psychiatric/Behavioral:  Negative for dysphoric mood. The patient is not nervous/anxious.     Current Outpatient Medications on File Prior to Visit  Medication Sig Dispense Refill   ezetimibe  (ZETIA ) 10 MG tablet TAKE ONE TABLET BY MOUTH EVERY DAY 30 tablet 2   fluticasone  (FLONASE ) 50 MCG/ACT nasal spray Place 2 sprays into both nostrils daily. 16 g 6   loratadine (CLARITIN) 10 MG tablet Take 10 mg by mouth daily.     tadalafil  (CIALIS ) 5 MG tablet Take 1  tablet (5 mg total) by mouth daily. 30 tablet 11   testosterone  cypionate (DEPOTESTOSTERONE CYPIONATE) 200 MG/ML injection Inject 1.75 mLs (350 mg total) into the muscle every 14 (fourteen) days. 10 mL 1   Turmeric (QC TUMERIC COMPLEX PO) Take 2 tablets by mouth daily.     No current facility-administered medications on file prior to visit.   Past Medical History:  Diagnosis Date   Hyperlipidemia    Past Surgical History:  Procedure Laterality Date   ANAL CYST EXCISION     CATARACT EXTRACTION Bilateral    lumbar back pain  04/10/2022   removed synovial cyst and treated spinal stenosis.   VASECTOMY      Family History  Problem Relation Age of Onset   Colon cancer Mother    Liver cancer Father    Heart disease Father    Ovarian cancer Maternal Aunt    Lung cancer Paternal Uncle    Lung cancer Paternal Uncle    Social History   Socioeconomic History   Marital status: Married    Spouse name: ANGIE   Number of children: 1   Years of education: 12 + SOME COLLEGE   Highest education level: GED or equivalent  Occupational History    Employer: [employer]  Tobacco Use   Smoking status: Never   Smokeless tobacco: Never  Vaping Use   Vaping status: Never Used  Substance and Sexual Activity   Alcohol use: Yes    Alcohol/week: 1.0 standard drink of alcohol    Types: 1 Glasses of wine per week    Comment: socially   Drug use: Not Currently   Sexual activity: Yes    Partners: Female  Other Topics Concern   Not on file  Social History Narrative   Not on file   Social Drivers of Health   Financial Resource Strain: Low Risk  (04/21/2024)   Overall Financial Resource Strain (CARDIA)    Difficulty of Paying Living Expenses: Not hard at all  Food Insecurity: No Food Insecurity (04/21/2024)   Hunger Vital Sign    Worried About Running Out of Food in the Last Year: Never true    Ran Out of Food in the Last Year: Never true  Transportation Needs: No Transportation Needs  (04/21/2024)   PRAPARE - Administrator, Civil Service (Medical): No    Lack of Transportation (Non-Medical): No  Physical Activity: Insufficiently Active (04/21/2024)   Exercise Vital Sign    Days of Exercise per Week: 3 days    Minutes of Exercise per Session: 10 min  Stress: No Stress Concern Present (04/21/2024)   [employer] of Occupational Health - Occupational Stress Questionnaire    Feeling of Stress: Only a little  Social Connections: Socially Integrated (04/21/2024)   Social Connection and Isolation Panel    Frequency of Communication with Friends and Family:  More than three times a week    Frequency of Social Gatherings with Friends and Family: More than three times a week    Attends Religious Services: More than 4 times per year    Active Member of [employer] or Organizations: Yes    Attends Engineer, Structural: More than 4 times per year    Marital Status: Married    Objective:  BP 126/78   Pulse 60   Temp 98.2 F (36.8 C) (Temporal)   Resp 18   Ht 6' 2 (1.88 m)   Wt 252 lb (114.3 kg)   BMI 32.35 kg/m      04/25/2024    8:23 AM 10/26/2023    8:15 AM 07/06/2023   10:54 AM  BP/Weight  Systolic BP 126 100 118  Diastolic BP 78 80 69  Wt. (Lbs) 252 254   BMI 32.35 kg/m2 32.61 kg/m2     Physical Exam Vitals reviewed.  Constitutional:      Appearance: Normal appearance.  HENT:     Right Ear: Tympanic membrane normal.     Left Ear: Tympanic membrane normal.     Nose: Congestion present.     Comments: BL sinus tenderness    Mouth/Throat:     Pharynx: No posterior oropharyngeal erythema.     Comments: hoarse Neck:     Vascular: No carotid bruit.  Cardiovascular:     Rate and Rhythm: Normal rate and regular rhythm.     Heart sounds: Normal heart sounds.  Pulmonary:     Effort: Pulmonary effort is normal.     Breath sounds: Normal breath sounds. No wheezing, rhonchi or rales.  Abdominal:     General: Bowel sounds are normal.      Palpations: Abdomen is soft.     Tenderness: There is no abdominal tenderness.  Lymphadenopathy:     Cervical: No cervical adenopathy.  Neurological:     Mental Status: He is alert and oriented to person, place, and time.  Psychiatric:        Mood and Affect: Mood normal.        Behavior: Behavior normal.         Lab Results  Component Value Date   WBC 7.2 04/25/2024   HGB 18.5 (H) 04/25/2024   HCT 56.3 (H) 04/25/2024   PLT 234 04/25/2024   GLUCOSE 102 (H) 10/26/2023   CHOL 210 (H) 10/26/2023   TRIG 111 10/26/2023   HDL 47 10/26/2023   LDLCALC 143 (H) 10/26/2023   ALT 30 10/26/2023   AST 26 10/26/2023   NA 137 10/26/2023   K 4.6 10/26/2023   CL 99 10/26/2023   CREATININE 1.07 10/26/2023   BUN 14 10/26/2023   CO2 21 10/26/2023   TSH 2.470 05/04/2022   HGBA1C 5.9 (A) 04/25/2024    Results for orders placed or performed in visit on 04/25/24  POCT Lipid Panel   Collection Time: 04/25/24  8:56 AM  Result Value Ref Range   TC 185    HDL 33    TRG 134    LDL 125    Non-HDL 152    TC/HDL    CBC with Differential/Platelet   Collection Time: 04/25/24  9:14 AM  Result Value Ref Range   WBC 7.2 3.4 - 10.8 x10E3/uL   RBC 5.95 (H) 4.14 - 5.80 x10E6/uL   Hemoglobin 18.5 (H) 13.0 - 17.7 g/dL   Hematocrit 43.6 (H) 62.4 - 51.0 %   MCV 95 79 - 97  fL   MCH 31.1 26.6 - 33.0 pg   MCHC 32.9 31.5 - 35.7 g/dL   RDW 86.4 88.3 - 84.5 %   Platelets 234 150 - 450 x10E3/uL   Neutrophils 72 Not Estab. %   Lymphs 18 Not Estab. %   Monocytes 8 Not Estab. %   Eos 1 Not Estab. %   Basos 1 Not Estab. %   Neutrophils Absolute 5.2 1.4 - 7.0 x10E3/uL   Lymphocytes Absolute 1.3 0.7 - 3.1 x10E3/uL   Monocytes Absolute 0.6 0.1 - 0.9 x10E3/uL   EOS (ABSOLUTE) 0.1 0.0 - 0.4 x10E3/uL   Basophils Absolute 0.1 0.0 - 0.2 x10E3/uL   Immature Granulocytes 0 Not Estab. %   Immature Grans (Abs) 0.0 0.0 - 0.1 x10E3/uL  POCT glycosylated hemoglobin (Hb A1C)   Collection Time: 04/25/24  9:33 AM   Result Value Ref Range   Hemoglobin A1C 5.9 (A) 4.0 - 5.6 %   HbA1c POC (<> result, manual entry)     HbA1c, POC (prediabetic range)     HbA1c, POC (controlled diabetic range)    .  Assessment & Plan:   Assessment & Plan Mixed hyperlipidemia - Discuss the importance of medication adherence for hyperlipidemia management. - recommend restart zetia  10 mg daily and take daily.  Orders:   POCT Lipid Panel  Prediabetes Improved glycemic control with recent A1c of 5.9%. Reports weight loss and dietary changes. Orders:   POCT glycosylated hemoglobin (Hb A1C)   CBC with Differential/Platelet  Other insomnia The current medical regimen is effective;  continue present plan and medications. Continue trazodone . Orders:   traZODone  (DESYREL ) 50 MG tablet; Take 0.5-1 tablets (25-50 mg total) by mouth at bedtime as needed for sleep.  Encounter for immunization  Orders:   Pneumococcal conjugate vaccine 20-valent  Acute non-recurrent sinusitis of other sinus Prescription: cefdinir  and hydromet cough syrup.     Seasonal allergic rhinitis due to pollen Allergic rhinitis managed with Claritin and Flonase . - Continue Claritin and Flonase  as needed.    Idiopathic chronic gout without tophus, unspecified site Gout with infrequent flare-ups. Recent episode resolved with indomethacin . Dietary changes may contribute to fewer flare-ups. - Continue indomethacin  as needed for flare-ups. - Encourage dietary modifications to prevent future flare-ups. - consider allopurinol in future    Class 1 obesity due to excess calories with serious comorbidity and body mass index (BMI) of 32.0 to 32.9 in adult Improving.  Comorbidity: hyperlipidemia.  Recommend continue to work on eating healthy diet and exercise.       Body mass index is 32.35 kg/m.    Meds ordered this encounter  Medications   traZODone  (DESYREL ) 50 MG tablet    Sig: Take 0.5-1 tablets (25-50 mg total) by mouth at bedtime as  needed for sleep.    Dispense:  30 tablet    Refill:  3   indomethacin  (INDOCIN ) 50 MG capsule    Sig: Take 1 capsule (50 mg total) by mouth 2 (two) times daily as needed.    Dispense:  60 capsule    Refill:  1   cefdinir  (OMNICEF ) 300 MG capsule    Sig: Take 1 capsule (300 mg total) by mouth 2 (two) times daily.    Dispense:  20 capsule    Refill:  0   HYDROcodone bit-homatropine (HYDROMET) 5-1.5 MG/5ML syrup    Sig: Take 5 mLs by mouth every 6 (six) hours as needed for cough.    Dispense:  120 mL    Refill:  0    Orders Placed This Encounter  Procedures   Pneumococcal conjugate vaccine 20-valent   CBC with Differential/Platelet   POCT glycosylated hemoglobin (Hb A1C)   POCT Lipid Panel     Follow-up: Return in about 4 months (around 08/26/2024) for chronic follow up, [facility]U CL[facility].  An After Visit Summary was printed and given to the patient.  Abigail Free, MD [facility] [phone]

## 2024-04-27 ENCOUNTER — Ambulatory Visit: Payer: Self-pay | Admitting: Family Medicine

## 2024-04-27 NOTE — Assessment & Plan Note (Addendum)
 Improved glycemic control with recent A1c of 5.9%. Reports weight loss and dietary changes. Orders:   POCT glycosylated hemoglobin (Hb A1C)   CBC with Differential/Platelet

## 2024-04-27 NOTE — Assessment & Plan Note (Addendum)
-   Discuss the importance of medication adherence for hyperlipidemia management. - recommend restart zetia  10 mg daily and take daily.  Orders:   POCT Lipid Panel

## 2024-05-03 ENCOUNTER — Encounter: Payer: Self-pay | Admitting: Family Medicine

## 2024-05-03 DIAGNOSIS — G4709 Other insomnia: Secondary | ICD-10-CM | POA: Insufficient documentation

## 2024-05-03 DIAGNOSIS — M1A00X0 Idiopathic chronic gout, unspecified site, without tophus (tophi): Secondary | ICD-10-CM | POA: Insufficient documentation

## 2024-05-03 NOTE — Assessment & Plan Note (Signed)
 Prescription: cefdinir  and hydromet cough syrup.

## 2024-05-03 NOTE — Assessment & Plan Note (Signed)
 Improving.  Comorbidity: hyperlipidemia.  Recommend continue to work on eating healthy diet and exercise.

## 2024-05-03 NOTE — Assessment & Plan Note (Addendum)
 The current medical regimen is effective;  continue present plan and medications. Continue trazodone . Orders:   traZODone  (DESYREL ) 50 MG tablet; Take 0.5-1 tablets (25-50 mg total) by mouth at bedtime as needed for sleep.

## 2024-05-03 NOTE — Assessment & Plan Note (Signed)
 Gout with infrequent flare-ups. Recent episode resolved with indomethacin . Dietary changes may contribute to fewer flare-ups. - Continue indomethacin  as needed for flare-ups. - Encourage dietary modifications to prevent future flare-ups. - consider allopurinol in future

## 2024-05-03 NOTE — Assessment & Plan Note (Signed)
 Allergic rhinitis managed with Claritin and Flonase . - Continue Claritin and Flonase  as needed.

## 2024-05-13 ENCOUNTER — Other Ambulatory Visit: Payer: Self-pay | Admitting: Physician Assistant

## 2024-05-13 DIAGNOSIS — Z125 Encounter for screening for malignant neoplasm of prostate: Secondary | ICD-10-CM

## 2024-06-04 ENCOUNTER — Other Ambulatory Visit: Payer: Self-pay | Admitting: Family Medicine

## 2024-06-25 ENCOUNTER — Other Ambulatory Visit: Payer: Self-pay | Admitting: Neurological Surgery

## 2024-06-25 ENCOUNTER — Ambulatory Visit (HOSPITAL_COMMUNITY)
Admission: RE | Admit: 2024-06-25 | Discharge: 2024-06-25 | Disposition: A | Discharge status: Home / Self Care | Source: Ambulatory Visit | Attending: Neurological Surgery | Admitting: Neurological Surgery

## 2024-06-25 DIAGNOSIS — M21371 Foot drop, right foot: Secondary | ICD-10-CM

## 2024-06-25 MED ORDER — GADOBUTROL 1 MMOL/ML IV SOLN
10.0000 mL | Freq: Once | INTRAVENOUS | Status: AC | PRN
  Administered 2024-06-25: 13:00:00 10 mL via INTRAVENOUS

## 2024-08-26 ENCOUNTER — Ambulatory Visit: Admitting: Family Medicine
